# Patient Record
Sex: Female | Born: 1975 | Race: White | Hispanic: No | Smoking: Never smoker
Health system: Southern US, Community
[De-identification: ages and names within clinical notes are randomized; demographics above are authoritative.]

## PROBLEM LIST (undated history)

## (undated) DIAGNOSIS — F419 Anxiety disorder, unspecified: Secondary | ICD-10-CM

## (undated) DIAGNOSIS — M1990 Unspecified osteoarthritis, unspecified site: Secondary | ICD-10-CM

## (undated) DIAGNOSIS — I10 Essential (primary) hypertension: Secondary | ICD-10-CM

## (undated) DIAGNOSIS — E785 Hyperlipidemia, unspecified: Secondary | ICD-10-CM

## (undated) DIAGNOSIS — R55 Syncope and collapse: Secondary | ICD-10-CM

## (undated) DIAGNOSIS — T7840XA Allergy, unspecified, initial encounter: Secondary | ICD-10-CM

## (undated) DIAGNOSIS — F32A Depression, unspecified: Secondary | ICD-10-CM

## (undated) DIAGNOSIS — E119 Type 2 diabetes mellitus without complications: Secondary | ICD-10-CM

## (undated) DIAGNOSIS — Z5189 Encounter for other specified aftercare: Secondary | ICD-10-CM

## (undated) HISTORY — DX: Unspecified osteoarthritis, unspecified site: M19.90

## (undated) HISTORY — PX: BILATERAL CARPAL TUNNEL RELEASE: SHX6508

## (undated) HISTORY — PX: CHOLECYSTECTOMY: SHX55

## (undated) HISTORY — DX: Type 2 diabetes mellitus without complications: E11.9

## (undated) HISTORY — PX: TOTAL ABDOMINAL HYSTERECTOMY W/ BILATERAL SALPINGOOPHORECTOMY: SHX83

## (undated) HISTORY — DX: Depression, unspecified: F32.A

## (undated) HISTORY — PX: ABDOMINAL HYSTERECTOMY: SHX81

## (undated) HISTORY — DX: Anxiety disorder, unspecified: F41.9

## (undated) HISTORY — DX: Hyperlipidemia, unspecified: E78.5

## (undated) HISTORY — DX: Allergy, unspecified, initial encounter: T78.40XA

## (undated) HISTORY — DX: Essential (primary) hypertension: I10

## (undated) HISTORY — DX: Encounter for other specified aftercare: Z51.89

## (undated) HISTORY — DX: Syncope and collapse: R55

---

## 2015-05-14 DIAGNOSIS — F32A Depression, unspecified: Secondary | ICD-10-CM | POA: Insufficient documentation

## 2016-07-13 DIAGNOSIS — G8929 Other chronic pain: Secondary | ICD-10-CM | POA: Insufficient documentation

## 2016-07-13 DIAGNOSIS — M545 Low back pain, unspecified: Secondary | ICD-10-CM | POA: Insufficient documentation

## 2017-11-01 DIAGNOSIS — G43909 Migraine, unspecified, not intractable, without status migrainosus: Secondary | ICD-10-CM | POA: Insufficient documentation

## 2018-09-22 DIAGNOSIS — E1169 Type 2 diabetes mellitus with other specified complication: Secondary | ICD-10-CM | POA: Insufficient documentation

## 2019-02-12 DIAGNOSIS — M26609 Unspecified temporomandibular joint disorder, unspecified side: Secondary | ICD-10-CM | POA: Insufficient documentation

## 2020-10-17 DIAGNOSIS — F419 Anxiety disorder, unspecified: Secondary | ICD-10-CM | POA: Diagnosis not present

## 2020-10-17 DIAGNOSIS — E785 Hyperlipidemia, unspecified: Secondary | ICD-10-CM | POA: Diagnosis not present

## 2020-10-17 DIAGNOSIS — I10 Essential (primary) hypertension: Secondary | ICD-10-CM | POA: Diagnosis not present

## 2020-10-17 DIAGNOSIS — R7303 Prediabetes: Secondary | ICD-10-CM | POA: Diagnosis not present

## 2020-11-05 DIAGNOSIS — E119 Type 2 diabetes mellitus without complications: Secondary | ICD-10-CM | POA: Diagnosis not present

## 2020-11-05 DIAGNOSIS — E785 Hyperlipidemia, unspecified: Secondary | ICD-10-CM | POA: Diagnosis not present

## 2020-11-05 DIAGNOSIS — I10 Essential (primary) hypertension: Secondary | ICD-10-CM | POA: Diagnosis not present

## 2020-11-13 DIAGNOSIS — E119 Type 2 diabetes mellitus without complications: Secondary | ICD-10-CM | POA: Diagnosis not present

## 2020-11-13 DIAGNOSIS — Z713 Dietary counseling and surveillance: Secondary | ICD-10-CM | POA: Diagnosis not present

## 2020-11-13 DIAGNOSIS — E1169 Type 2 diabetes mellitus with other specified complication: Secondary | ICD-10-CM | POA: Insufficient documentation

## 2021-03-28 ENCOUNTER — Encounter: Payer: Self-pay | Admitting: Nurse Practitioner

## 2021-03-28 DIAGNOSIS — J309 Allergic rhinitis, unspecified: Secondary | ICD-10-CM | POA: Insufficient documentation

## 2021-03-28 DIAGNOSIS — I152 Hypertension secondary to endocrine disorders: Secondary | ICD-10-CM | POA: Insufficient documentation

## 2021-03-28 DIAGNOSIS — E1159 Type 2 diabetes mellitus with other circulatory complications: Secondary | ICD-10-CM | POA: Insufficient documentation

## 2021-03-28 DIAGNOSIS — K219 Gastro-esophageal reflux disease without esophagitis: Secondary | ICD-10-CM | POA: Insufficient documentation

## 2021-03-28 DIAGNOSIS — Z90710 Acquired absence of both cervix and uterus: Secondary | ICD-10-CM | POA: Insufficient documentation

## 2021-04-07 ENCOUNTER — Ambulatory Visit (INDEPENDENT_AMBULATORY_CARE_PROVIDER_SITE_OTHER): Payer: BC Managed Care – PPO | Admitting: Nurse Practitioner

## 2021-04-07 ENCOUNTER — Encounter: Payer: Self-pay | Admitting: Nurse Practitioner

## 2021-04-07 ENCOUNTER — Other Ambulatory Visit: Payer: Self-pay

## 2021-04-07 VITALS — BP 128/82 | HR 76 | Temp 98.8°F | Ht 60.0 in | Wt 193.0 lb

## 2021-04-07 DIAGNOSIS — E785 Hyperlipidemia, unspecified: Secondary | ICD-10-CM | POA: Diagnosis not present

## 2021-04-07 DIAGNOSIS — F32A Depression, unspecified: Secondary | ICD-10-CM

## 2021-04-07 DIAGNOSIS — F419 Anxiety disorder, unspecified: Secondary | ICD-10-CM

## 2021-04-07 DIAGNOSIS — J3489 Other specified disorders of nose and nasal sinuses: Secondary | ICD-10-CM | POA: Diagnosis not present

## 2021-04-07 DIAGNOSIS — E1159 Type 2 diabetes mellitus with other circulatory complications: Secondary | ICD-10-CM

## 2021-04-07 DIAGNOSIS — I152 Hypertension secondary to endocrine disorders: Secondary | ICD-10-CM

## 2021-04-07 DIAGNOSIS — E1169 Type 2 diabetes mellitus with other specified complication: Secondary | ICD-10-CM | POA: Diagnosis not present

## 2021-04-07 DIAGNOSIS — Z7689 Persons encountering health services in other specified circumstances: Secondary | ICD-10-CM

## 2021-04-07 DIAGNOSIS — R053 Chronic cough: Secondary | ICD-10-CM | POA: Insufficient documentation

## 2021-04-07 DIAGNOSIS — R0683 Snoring: Secondary | ICD-10-CM

## 2021-04-07 LAB — MICROALBUMIN, URINE WAIVED
Creatinine, Urine Waived: 50 mg/dL (ref 10–300)
Microalb, Ur Waived: 10 mg/L (ref 0–19)
Microalb/Creat Ratio: <30 mg/g (ref <30)

## 2021-04-07 LAB — VERITOR FLU A/B WAIVED
Influenza A: NEGATIVE
Influenza B: NEGATIVE

## 2021-04-07 LAB — BAYER DCA HB A1C WAIVED: HB A1C (BAYER DCA - WAIVED): 6.4 % — ABNORMAL HIGH (ref 4.8–5.6)

## 2021-04-07 MED ORDER — BAYER CONTOUR LINK 2.4 W/DEVICE KIT: PACK | 0 refills | Status: DC

## 2021-04-07 MED ORDER — ACCU-CHEK SOFTCLIX LANCETS MISC: 12 refills | Status: DC

## 2021-04-07 MED ORDER — GLUCOSE BLOOD VI STRP: ORAL_STRIP | 12 refills | Status: DC

## 2021-04-07 NOTE — Assessment & Plan Note (Signed)
Acute and starting with symptoms this morning -- will obtain Covid and flu testing in office.  Recommend she avoid Sudafed and Ibuprofen at home due to HTN -- recommend she use Coricidin and Claritin or Allegra as needed for symptoms control.  No abx at this time due to symptoms <24 hours.  Recommend: - Increased rest - Increasing Fluids - Acetaminophen as needed for fever/pain.  - Salt water gargling, chloraseptic spray and throat lozenges - Diabetic Tussin, Coricidin, or Claritin/Allegra as needed - Humidifying the air Return to office if ongoing or worsening -- will treat as needed with abx if symptoms ongoing 7 days or greater.

## 2021-04-07 NOTE — Assessment & Plan Note (Signed)
BMI 37.69 with T2DM, HTN/HLD.  Recommended eating smaller high protein, low fat meals more frequently and exercising 30 mins a day 5 times a week with a goal of 10-15lb weight loss in the next 3 months. Patient voiced their understanding and motivation to adhere to these recommendations.

## 2021-04-07 NOTE — Assessment & Plan Note (Signed)
Chronic, ongoing.  Continue current medication regimen and adjust as needed. Lipid panel today. 

## 2021-04-07 NOTE — Assessment & Plan Note (Signed)
Chronic, stable, denies SI/HI.  Has been on Prozac with benefit for years, continue this regimen and adjust as needed.  She denies need for refills at this time, send as needed.

## 2021-04-07 NOTE — Assessment & Plan Note (Signed)
Chronic, ongoing with initial BP elevated today (recently took Sudafed for sinus cold and Ibuprofen -- advised her not to do this), repeat BP showed improvement.  Recommend she monitor BP at least a few mornings a week at home and document.  DASH diet at home.  Continue current medication regimen and adjust as needed.  Labs today: CBC, TSH, CMP.  Return in February for physical.

## 2021-04-07 NOTE — Progress Notes (Signed)
New Patient Office Visit  Subjective:  Patient ID: Tiffany Odom, female    DOB: [birth year]  Age: 45 y.o. MRN: [id number]  CC:  Chief Complaint  Patient presents with   Establish Care    Patient states she is due for A1c testing which is due next month and was wondering if she is to have it completed at today's visit.    Sinus Problem    Patient states she woke up with a sore throat and nasal sinus drip. Patient denies taking an at-home COVID test. Patient states she took a nasal decongestant about 9 this morning and states it did help a little bit. Patient states that she has a history of getting real bad recurring sinus infections.   Sore Throat   Medication Management    Patient states she was on Lisinopril for about 10 years and then she started noticing symptoms of itching and flares up. Patient states she feels that medication helped a lot more than her current blood pressure medication. Patient states it works sometimes but she rather be taking some thing close to Lisinopril.    HPI Tiffany Odom presents for new patient visit to establish care.  Introduced to Designer, jewellery role and practice setting.  All questions answered.  Discussed provider/patient relationship and expectations. Recently moved from [facility] to [place] -- works at [employer].  SINUS ISSUES Woke up this morning with a little sore throat and sinus drainage.  No Covid testing at home.  Has had Covid vaccines. Fever: no Cough: no Shortness of breath: no Wheezing: no Chest pain: no Chest tightness: no Chest congestion: no Nasal congestion: yes Runny nose: yes Post nasal drip: yes Sneezing: no Sore throat: yes Swollen glands: no Sinus pressure: yes Headache: yes Face pain: no Toothache: no Ear pain: none Ear pressure: yes bilateral Eyes red/itching:no Eye drainage/crusting: no  Vomiting: no Rash: no Fatigue: yes Sick contacts: no Strep contacts: no  Context: stable Recurrent sinusitis:  no Relief with OTC cold/cough medications: yes  Treatments attempted:  decongestant and Ibuprofen     DIABETES Currently taking no medications, her last A1c was 6.5% on 10/20/20.  Has never taken medications for this, at April visit is when diabetes was first diagnosed. Hypoglycemic episodes:no Polydipsia/polyuria: no Visual disturbance: no Chest pain: no Paresthesias: no Glucose Monitoring: no  Accucheck frequency: Not Checking  Fasting glucose:  Post prandial:  Evening:  Before meals: Taking Insulin?: no  Long acting insulin:  Short acting insulin: Blood Pressure Monitoring: rarely Retinal Examination: Not up to Date Foot Exam: Up to Date Diabetic Education:  did one class Pneumovax: Not up to Date Influenza:  would like today Aspirin: no   HYPERTENSION / HYPERLIPIDEMIA Currently taking Atorvastatin and Irbesartan daily -- started on Irbesartan back in April or May due to cough with ACE.  Last LDL on 10/17/20 = 170.  Has cough with Lisinopril after being on this for 10 years.  Rosuvastatin caused muscle aches.  Did take Sudafed this morning and BP elevated + Ibuprofen. Satisfied with current treatment? yes Duration of hypertension: chronic BP monitoring frequency: rarely BP range:  BP medication side effects: no Past BP meds: Lisinopril Duration of hyperlipidemia: chronic Cholesterol medication side effects: no Cholesterol supplements: none Past cholesterol medications: Rosuvastatin Medication compliance: good compliance Aspirin: no Recent stressors: no Recurrent headaches: no Visual changes: no Palpitations: no Dyspnea: no Chest pain: no Lower extremity edema: no Dizzy/lightheaded: no   DEPRESSION Takes Prozac for depression daily, has been on for  14 years. Mood status: stable Satisfied with current treatment?: yes Symptom severity: moderate  Duration of current treatment : chronic Side effects: no Medication compliance: good  compliance Psychotherapy/counseling: yes in past Previous psychiatric medications: none Depressed mood: no Anxious mood: no Anhedonia: no Significant weight loss or gain: no Insomnia: none Fatigue: yes -- this has been ongoing for a long while Feelings of worthlessness or guilt: no Impaired concentration/indecisiveness: no Suicidal ideations: no Hopelessness: no Crying spells: no Depression screen PHQ 2/9 04/07/2021  Decreased Interest 0  Down, Depressed, Hopeless 1  PHQ - 2 Score 1  Altered sleeping 0  Tired, decreased energy 3  Change in appetite 0  Feeling bad or failure about yourself  0  Trouble concentrating 1  Moving slowly or fidgety/restless 0  Suicidal thoughts 0  PHQ-9 Score 5  Difficult doing work/chores Somewhat difficult    Past Medical History:  Diagnosis Date   Depression    Diabetes ([place])    Hyperlipemia    Hypertension     Past Surgical History:  Procedure Laterality Date   ABDOMINAL HYSTERECTOMY     BILATERAL CARPAL TUNNEL RELEASE     CESAREAN SECTION     CHOLECYSTECTOMY      Family History  Problem Relation Age of Onset   Diabetes Mother    ADD / ADHD Mother    Heart Problems Mother    Diabetes Father    Heart Problems Father    Healthy Sister    Healthy Sister    Healthy Sister    Healthy Sister    Healthy Sister    Healthy Brother    Heart Problems Maternal Grandmother    Diabetes Maternal Grandmother    Heart Problems Paternal Grandmother    Diabetes Paternal Grandmother     Social History   Socioeconomic History   Marital status: Divorced    Spouse name: Not on file   Number of children: Not on file   Years of education: Not on file   Highest education level: Not on file  Occupational History   Not on file  Tobacco Use   Smoking status: Never   Smokeless tobacco: Never  Vaping Use   Vaping Use: Never used  Substance and Sexual Activity   Alcohol use: Yes   Drug use: Not on file   Sexual activity: Not on file   Other Topics Concern   Not on file  Social History Narrative   Not on file   Social Determinants of Health   Financial Resource Strain: Low Risk    Difficulty of Paying Living Expenses: Not hard at all  Food Insecurity: No Food Insecurity   Worried About Charity fundraiser in the Last Year: Never true   Ran Out of Food in the Last Year: Never true  Transportation Needs: No Transportation Needs   Lack of Transportation (Medical): No   Lack of Transportation (Non-Medical): No  Physical Activity: Insufficiently Active   Days of Exercise per Week: 4 days   Minutes of Exercise per Session: 30 min  Stress: No Stress Concern Present   Feeling of Stress : Only a little  Social Connections: Socially Isolated   Frequency of Communication with Friends and Family: Three times a week   Frequency of Social Gatherings with Friends and Family: Three times a week   Attends Religious Services: Never   Active Member of Clubs or Organizations: Not on file   Attends Archivist Meetings: Never   Marital Status: Separated  Intimate Partner Violence: Not At Risk   Fear of Current or Ex-Partner: No   Emotionally Abused: No   Physically Abused: No   Sexually Abused: No    ROS Review of Systems  Constitutional:  Positive for fatigue. Negative for activity change, appetite change and fever.  HENT:  Positive for congestion, postnasal drip, rhinorrhea, sinus pressure and sore throat. Negative for ear discharge, ear pain, facial swelling, sinus pain, sneezing and voice change.   Respiratory:  Negative for cough, chest tightness, shortness of breath and wheezing.   Cardiovascular:  Negative for chest pain, palpitations and leg swelling.  Gastrointestinal: Negative.   Endocrine: Negative.   Neurological:  Negative for dizziness, numbness and headaches.  Psychiatric/Behavioral: Negative.     Objective:   Today's Vitals: BP 128/82 (BP Location: Left Arm, Patient Position: Sitting, Cuff Size:  Normal)   Pulse 76   Temp 98.8 F (37.1 C) (Oral)   Ht 5' (1.524 m)   Wt 193 lb (87.5 kg)   SpO2 98%   BMI 37.69 kg/m   Physical Exam Vitals and nursing note reviewed.  Constitutional:      General: She is awake. She is not in acute distress.    Appearance: She is well-developed and well-groomed. She is obese. She is not ill-appearing or toxic-appearing.  HENT:     Head: Normocephalic.     Right Ear: Hearing, ear canal and external ear normal. A middle ear effusion is present.     Left Ear: Hearing, ear canal and external ear normal. A middle ear effusion is present.     Nose: Rhinorrhea present. Rhinorrhea is clear.     Right Sinus: No maxillary sinus tenderness or frontal sinus tenderness.     Left Sinus: No maxillary sinus tenderness or frontal sinus tenderness.     Mouth/Throat:     Mouth: Mucous membranes are moist.     Pharynx: Oropharynx is clear. Posterior oropharyngeal erythema (mild with cobblestoning) present. No pharyngeal swelling or oropharyngeal exudate.     Tonsils: No tonsillar exudate. 2+ on the right. 2+ on the left.  Eyes:     General: Lids are normal.        Right eye: No discharge.        Left eye: No discharge.     Conjunctiva/sclera: Conjunctivae normal.     Pupils: Pupils are equal, round, and reactive to light.  Neck:     Thyroid: No thyromegaly.     Vascular: No carotid bruit.  Cardiovascular:     Rate and Rhythm: Normal rate and regular rhythm.     Heart sounds: Normal heart sounds. No murmur heard.   No gallop.  Pulmonary:     Effort: Pulmonary effort is normal. No accessory muscle usage or respiratory distress.     Breath sounds: Normal breath sounds.  Abdominal:     General: Bowel sounds are normal.     Palpations: Abdomen is soft. There is no hepatomegaly or splenomegaly.  Musculoskeletal:     Cervical back: Normal range of motion and neck supple.     Right lower leg: No edema.     Left lower leg: No edema.  Lymphadenopathy:      Cervical: No cervical adenopathy.  Skin:    General: Skin is warm and dry.  Neurological:     Mental Status: She is alert and oriented to person, place, and time.     Deep Tendon Reflexes: Reflexes are normal and symmetric.     Reflex  Scores:      Brachioradialis reflexes are 2+ on the right side and 2+ on the left side.      Patellar reflexes are 2+ on the right side and 2+ on the left side. Psychiatric:        Attention and Perception: Attention normal.        Mood and Affect: Mood normal.        Speech: Speech normal.        Behavior: Behavior normal. Behavior is cooperative.        Thought Content: Thought content normal.    Assessment & Plan:   Problem List Items Addressed This Visit       Cardiovascular and Mediastinum   Hypertension associated with diabetes ([place])    Chronic, ongoing with initial BP elevated today (recently took Sudafed for sinus cold and Ibuprofen -- advised her not to do this), repeat BP showed improvement.  Recommend she monitor BP at least a few mornings a week at home and document.  DASH diet at home.  Continue current medication regimen and adjust as needed.  Labs today: CBC, TSH, CMP.  Return in February for physical.       Relevant Medications   atorvastatin (LIPITOR) 80 MG tablet   irbesartan (AVAPRO) 150 MG tablet   Other Relevant Orders   Comprehensive metabolic panel   TSH   CBC with Differential/Platelet   Ambulatory referral to Sleep Studies     Endocrine   Hyperlipidemia associated with type 2 diabetes mellitus (HCC)    Chronic, ongoing.  Continue current medication regimen and adjust as needed.  Lipid panel today.         Relevant Medications   atorvastatin (LIPITOR) 80 MG tablet   irbesartan (AVAPRO) 150 MG tablet   Other Relevant Orders   Comprehensive metabolic panel   Lipid Panel w/o Chol/HDL Ratio   Type 2 diabetes mellitus with morbid obesity (HCC)    Chronic, ongoing with A1c downward trend today at 6.4% and urine ALB 10.   At this time will continue diet control, discussed with patient options available if elevation in future; such as GLP1, SGLT2, or Metformin.  She is working on diet changes and regular exercise with walks.  Recommend she check blood sugar at home a few days a week with goal in morning fasting <130 and goal 2 hours after eating <180.  Return in February for physical.      Relevant Medications   atorvastatin (LIPITOR) 80 MG tablet   irbesartan (AVAPRO) 150 MG tablet   Other Relevant Orders   Bayer DCA Hb A1c Waived (Completed)   Comprehensive metabolic panel   Microalbumin, Urine Waived (Completed)     Other   Anxiety and depression    Chronic, stable, denies SI/HI.  Has been on Prozac with benefit for years, continue this regimen and adjust as needed.  She denies need for refills at this time, send as needed.      Relevant Medications   FLUoxetine (PROZAC) 20 MG capsule   Morbid obesity (HCC)    BMI 37.69 with T2DM, HTN/HLD.  Recommended eating smaller high protein, low fat meals more frequently and exercising 30 mins a day 5 times a week with a goal of 10-15lb weight loss in the next 3 months. Patient voiced their understanding and motivation to adhere to these recommendations.       Relevant Orders   Ambulatory referral to Sleep Studies   Sinus pressure    Acute and  starting with symptoms this morning -- will obtain Covid and flu testing in office.  Recommend she avoid Sudafed and Ibuprofen at home due to HTN -- recommend she use Coricidin and Claritin or Allegra as needed for symptoms control.  No abx at this time due to symptoms <24 hours.  Recommend: - Increased rest - Increasing Fluids - Acetaminophen as needed for fever/pain.  - Salt water gargling, chloraseptic spray and throat lozenges - Diabetic Tussin, Coricidin, or Claritin/Allegra as needed - Humidifying the air Return to office if ongoing or worsening -- will treat as needed with abx if symptoms ongoing 7 days or  greater.      Relevant Orders   Veritor Flu A/B Waived (Completed)   Novel Coronavirus, NAA ([facility])   Other Visit Diagnoses     Encounter to establish care    -  Primary   Snores       Send for sleep study.  Referral in place.   Relevant Orders   Ambulatory referral to Sleep Studies       Outpatient Encounter Medications as of 04/07/2021  Medication Sig   Accu-Chek Softclix Lancets lancets Use to check blood sugars 2-3 times daily with goals = <130 fasting in morning and <180 two hours after eating.  Bring blood sugar log to appointments.   atorvastatin (LIPITOR) 80 MG tablet Take by mouth.   Blood Glucose Monitoring Suppl (BAYER CONTOUR LINK 2.4) w/Device KIT Use to check blood sugar 1-2 times daily with goal <130 fasting in morning and <180 two hours after eating.   FLUoxetine (PROZAC) 20 MG capsule Take by mouth.   glucose blood test strip Use to check blood sugar 3 times daily, fasting in morning with goal <130 and 2 hours after meals with goal <180.  Bring blood sugar log to visits.   irbesartan (AVAPRO) 150 MG tablet Take by mouth.   No facility-administered encounter medications on file as of 04/07/2021.    Follow-up: Return in about 4 months (around 08/05/2021) for Annual physical.   Venita Lick, NP

## 2021-04-07 NOTE — Assessment & Plan Note (Signed)
Chronic, ongoing with A1c downward trend today at 6.4% and urine ALB 10.  At this time will continue diet control, discussed with patient options available if elevation in future; such as GLP1, SGLT2, or Metformin.  She is working on diet changes and regular exercise with walks.  Recommend she check blood sugar at home a few days a week with goal in morning fasting <130 and goal 2 hours after eating <180.  Return in February for physical.

## 2021-04-07 NOTE — Patient Instructions (Signed)
Diabetes Mellitus and Nutrition, Adult When you have diabetes, or diabetes mellitus, it is very important to have healthy eating habits because your blood sugar (glucose) levels are greatly affected by what you eat and drink. Eating healthy foods in the right amounts, at about the same times every day, can help you:  Control your blood glucose.  Lower your risk of heart disease.  Improve your blood pressure.  Reach or maintain a healthy weight. What can affect my meal plan? Every person with diabetes is different, and each person has different needs for a meal plan. Your health care provider may recommend that you work with a dietitian to make a meal plan that is best for you. Your meal plan may vary depending on factors such as:  The calories you need.  The medicines you take.  Your weight.  Your blood glucose, blood pressure, and cholesterol levels.  Your activity level.  Other health conditions you have, such as heart or kidney disease. How do carbohydrates affect me? Carbohydrates, also called carbs, affect your blood glucose level more than any other type of food. Eating carbs naturally raises the amount of glucose in your blood. Carb counting is a method for keeping track of how many carbs you eat. Counting carbs is important to keep your blood glucose at a healthy level, especially if you use insulin or take certain oral diabetes medicines. It is important to know how many carbs you can safely have in each meal. This is different for every person. Your dietitian can help you calculate how many carbs you should have at each meal and for each snack. How does alcohol affect me? Alcohol can cause a sudden decrease in blood glucose (hypoglycemia), especially if you use insulin or take certain oral diabetes medicines. Hypoglycemia can be a life-threatening condition. Symptoms of hypoglycemia, such as sleepiness, dizziness, and confusion, are similar to symptoms of having too much  alcohol.  Do not drink alcohol if: ? Your health care provider tells you not to drink. ? You are pregnant, may be pregnant, or are planning to become pregnant.  If you drink alcohol: ? Do not drink on an empty stomach. ? Limit how much you use to:  0-1 drink a day for women.  0-2 drinks a day for men. ? Be aware of how much alcohol is in your drink. In the [place] one drink equals one 12 oz bottle of beer (355 mL), one 5 oz glass of wine (148 mL), or one 1 oz glass of hard liquor (44 mL). ? Keep yourself hydrated with water, diet soda, or unsweetened iced tea.  Keep in mind that regular soda, juice, and other mixers may contain a lot of sugar and must be counted as carbs. What are tips for following this plan? Reading food labels  Start by checking the serving size on the "Nutrition Facts" label of packaged foods and drinks. The amount of calories, carbs, fats, and other nutrients listed on the label is based on one serving of the item. Many items contain more than one serving per package.  Check the total grams (g) of carbs in one serving. You can calculate the number of servings of carbs in one serving by dividing the total carbs by 15. For example, if a food has 30 g of total carbs per serving, it would be equal to 2 servings of carbs.  Check the number of grams (g) of saturated fats and trans fats in one serving. Choose foods that have   a low amount or none of these fats.  Check the number of milligrams (mg) of salt (sodium) in one serving. Most people should limit total sodium intake to less than 2,300 mg per day.  Always check the nutrition information of foods labeled as "low-fat" or "nonfat." These foods may be higher in added sugar or refined carbs and should be avoided.  Talk to your dietitian to identify your daily goals for nutrients listed on the label. Shopping  Avoid buying canned, pre-made, or processed foods. These foods tend to be high in fat, sodium, and added  sugar.  Shop around the outside edge of the grocery store. This is where you will most often find fresh fruits and vegetables, bulk grains, fresh meats, and fresh dairy. Cooking  Use low-heat cooking methods, such as baking, instead of high-heat cooking methods like deep frying.  Cook using healthy oils, such as olive, canola, or sunflower oil.  Avoid cooking with butter, cream, or high-fat meats. Meal planning  Eat meals and snacks regularly, preferably at the same times every day. Avoid going long periods of time without eating.  Eat foods that are high in fiber, such as fresh fruits, vegetables, beans, and whole grains. Talk with your dietitian about how many servings of carbs you can eat at each meal.  Eat 4-6 oz (112-168 g) of lean protein each day, such as lean meat, chicken, fish, eggs, or tofu. One ounce (oz) of lean protein is equal to: ? 1 oz (28 g) of meat, chicken, or fish. ? 1 egg. ?  cup (62 g) of tofu.  Eat some foods each day that contain healthy fats, such as avocado, nuts, seeds, and fish.   What foods should I eat? Fruits Berries. Apples. Oranges. Peaches. Apricots. Plums. Grapes. Mango. Papaya. Pomegranate. Kiwi. Cherries. Vegetables Lettuce. Spinach. Leafy greens, including kale, chard, collard greens, and mustard greens. Beets. Cauliflower. Cabbage. Broccoli. Carrots. Green beans. Tomatoes. Peppers. Onions. Cucumbers. Brussels sprouts. Grains Whole grains, such as whole-wheat or whole-grain bread, crackers, tortillas, cereal, and pasta. Unsweetened oatmeal. Quinoa. Brown or wild rice. Meats and other proteins Seafood. Poultry without skin. Lean cuts of poultry and beef. Tofu. Nuts. Seeds. Dairy Low-fat or fat-free dairy products such as milk, yogurt, and cheese. The items listed above may not be a complete list of foods and beverages you can eat. Contact a dietitian for more information. What foods should I avoid? Fruits Fruits canned with  syrup. Vegetables Canned vegetables. Frozen vegetables with butter or cream sauce. Grains Refined white flour and flour products such as bread, pasta, snack foods, and cereals. Avoid all processed foods. Meats and other proteins Fatty cuts of meat. Poultry with skin. Breaded or fried meats. Processed meat. Avoid saturated fats. Dairy Full-fat yogurt, cheese, or milk. Beverages Sweetened drinks, such as soda or iced tea. The items listed above may not be a complete list of foods and beverages you should avoid. Contact a dietitian for more information. Questions to ask a health care provider  Do I need to meet with a diabetes educator?  Do I need to meet with a dietitian?  What number can I call if I have questions?  When are the best times to check my blood glucose? Where to find more information:  American Diabetes Association: diabetes.org  Academy of Nutrition and Dietetics: www.eatright.org  National Institute of Diabetes and Digestive and Kidney Diseases: www.niddk.nih.gov  Association of Diabetes Care and Education Specialists: www.diabeteseducator.org Summary  It is important to have healthy eating   habits because your blood sugar (glucose) levels are greatly affected by what you eat and drink.  A healthy meal plan will help you control your blood glucose and maintain a healthy lifestyle.  Your health care provider may recommend that you work with a dietitian to make a meal plan that is best for you.  Keep in mind that carbohydrates (carbs) and alcohol have immediate effects on your blood glucose levels. It is important to count carbs and to use alcohol carefully. This information is not intended to replace advice given to you by your health care provider. Make sure you discuss any questions you have with your health care provider. Document Revised: 05/22/2019 Document Reviewed: 05/22/2019 Elsevier Patient Education  2021 Elsevier Inc.  

## 2021-04-08 LAB — CBC WITH DIFFERENTIAL/PLATELET
Basophils Absolute: 0.1 10*3/uL (ref 0.0–0.2)
Basos: 1 %
EOS (ABSOLUTE): 0.2 10*3/uL (ref 0.0–0.4)
Eos: 2 %
Hematocrit: 40.2 % (ref 34.0–46.6)
Hemoglobin: 13.8 g/dL (ref 11.1–15.9)
Immature Grans (Abs): 0 10*3/uL (ref 0.0–0.1)
Immature Granulocytes: 0 %
Lymphocytes Absolute: 2.7 10*3/uL (ref 0.7–3.1)
Lymphs: 29 %
MCH: 30.7 pg (ref 26.6–33.0)
MCHC: 34.3 g/dL (ref 31.5–35.7)
MCV: 89 fL (ref 79–97)
Monocytes Absolute: 0.6 10*3/uL (ref 0.1–0.9)
Monocytes: 6 %
Neutrophils Absolute: 5.9 10*3/uL (ref 1.4–7.0)
Neutrophils: 62 %
Platelets: 362 10*3/uL (ref 150–450)
RBC: 4.5 x10E6/uL (ref 3.77–5.28)
RDW: 11.8 % (ref 11.7–15.4)
WBC: 9.5 10*3/uL (ref 3.4–10.8)

## 2021-04-08 LAB — LIPID PANEL W/O CHOL/HDL RATIO
Cholesterol, Total: 226 mg/dL — ABNORMAL HIGH (ref 100–199)
HDL: 27 mg/dL — ABNORMAL LOW (ref >39)
LDL Chol Calc (NIH): 120 mg/dL — ABNORMAL HIGH (ref 0–99)
Triglycerides: 443 mg/dL — ABNORMAL HIGH (ref 0–149)
VLDL Cholesterol Cal: 79 mg/dL — ABNORMAL HIGH (ref 5–40)

## 2021-04-08 LAB — COMPREHENSIVE METABOLIC PANEL
ALT: 24 IU/L (ref 0–32)
AST: 20 IU/L (ref 0–40)
Albumin/Globulin Ratio: 1.7 (ref 1.2–2.2)
Albumin: 4.4 g/dL (ref 3.8–4.8)
Alkaline Phosphatase: 103 IU/L (ref 44–121)
BUN/Creatinine Ratio: 19 (ref 9–23)
BUN: 17 mg/dL (ref 6–24)
Bilirubin Total: 0.6 mg/dL (ref 0.0–1.2)
CO2: 24 mmol/L (ref 20–29)
Calcium: 9.6 mg/dL (ref 8.7–10.2)
Chloride: 98 mmol/L (ref 96–106)
Creatinine, Ser: 0.91 mg/dL (ref 0.57–1.00)
Globulin, Total: 2.6 g/dL (ref 1.5–4.5)
Glucose: 104 mg/dL — ABNORMAL HIGH (ref 70–99)
Potassium: 4 mmol/L (ref 3.5–5.2)
Sodium: 136 mmol/L (ref 134–144)
Total Protein: 7 g/dL (ref 6.0–8.5)
eGFR: 80 mL/min/{1.73_m2} (ref >59)

## 2021-04-08 LAB — NOVEL CORONAVIRUS, NAA: SARS-CoV-2, NAA: NOT DETECTED

## 2021-04-08 LAB — TSH: TSH: 1.34 u[IU]/mL (ref 0.450–4.500)

## 2021-04-08 LAB — SARS-COV-2, NAA 2 DAY TAT

## 2021-04-08 NOTE — Progress Notes (Signed)
Contacted via MyChart   Good afternoon Dabney, your Covid returned negative too.  This is most likely another viral cold, there are quite a few going around.  Continue resting at home and increasing fluid + using over the counter medications we discussed.  If any worsening or have symptoms 7 days or more then we will start antibiotic.  Have a great day!! Keep being amazing!!  Thank you for allowing me to participate in your care.  I appreciate you. Kindest regards, Virjean Boman

## 2021-04-13 ENCOUNTER — Telehealth: Payer: Self-pay

## 2021-04-13 MED ORDER — AMOXICILLIN-POT CLAVULANATE 875-125 MG PO TABS: 1.0000 | ORAL_TABLET | Freq: Two times a day (BID) | ORAL | 0 refills | Status: DC

## 2021-04-13 MED ORDER — ONETOUCH VERIO VI STRP: ORAL_STRIP | 12 refills | Status: DC

## 2021-04-13 MED ORDER — ONETOUCH VERIO W/DEVICE KIT: PACK | 0 refills | Status: DC

## 2021-04-13 MED ORDER — ONETOUCH ULTRASOFT LANCETS MISC: 12 refills | Status: DC

## 2021-04-13 MED ORDER — PREDNISONE 20 MG PO TABS: 40.0000 mg | ORAL_TABLET | Freq: Every day | ORAL | 0 refills | Status: DC

## 2021-04-13 NOTE — Addendum Note (Signed)
Addended by: Aura Dials T on: 04/13/2021 04:44 PM   Modules accepted: Orders

## 2021-04-13 NOTE — Telephone Encounter (Addendum)
  Pt returned our call. Reviewed lab results.  Pt is still experiencing sinus pressure, a runny nose cough, and producing yellowish mucous.   Pt has taken musinex sinus an OTC medication which has provided little relief.   Pt Jolene's note - pt would like something called in to help manage s/s.         ----- Message from Malen Gauze, CMA sent at 04/13/2021  8:47 AM EDT ----- Left message for patient to give our office a call back to discuss recent lab results.   OK for [facility] to give result note if patient calls back.

## 2021-04-14 ENCOUNTER — Other Ambulatory Visit: Payer: Self-pay | Admitting: Nurse Practitioner

## 2021-04-14 MED ORDER — PREDNISONE 20 MG PO TABS: 40.0000 mg | ORAL_TABLET | Freq: Every day | ORAL | 0 refills | Status: AC

## 2021-04-14 MED ORDER — AMOXICILLIN-POT CLAVULANATE 875-125 MG PO TABS: 1.0000 | ORAL_TABLET | Freq: Two times a day (BID) | ORAL | 0 refills | Status: AC

## 2021-05-01 ENCOUNTER — Other Ambulatory Visit: Payer: Self-pay

## 2021-05-01 ENCOUNTER — Ambulatory Visit (INDEPENDENT_AMBULATORY_CARE_PROVIDER_SITE_OTHER): Payer: BC Managed Care – PPO | Admitting: Nurse Practitioner

## 2021-05-01 ENCOUNTER — Encounter: Payer: Self-pay | Admitting: Nurse Practitioner

## 2021-05-01 DIAGNOSIS — R053 Chronic cough: Secondary | ICD-10-CM

## 2021-05-01 MED ORDER — ALBUTEROL SULFATE HFA 108 (90 BASE) MCG/ACT IN AERS: 2.0000 | INHALATION_SPRAY | Freq: Four times a day (QID) | RESPIRATORY_TRACT | 0 refills | Status: DC | PRN

## 2021-05-01 MED ORDER — DOXYCYCLINE HYCLATE 100 MG PO TABS: 100.0000 mg | ORAL_TABLET | Freq: Two times a day (BID) | ORAL | 0 refills | Status: DC

## 2021-05-01 MED ORDER — PREDNISONE 20 MG PO TABS: 40.0000 mg | ORAL_TABLET | Freq: Every day | ORAL | 0 refills | Status: AC

## 2021-05-01 NOTE — Progress Notes (Signed)
BP (!) 151/87   Pulse 71   Temp 98.5 F (36.9 C) (Oral)   Ht 5' (1.524 m)   Wt 192 lb 9.6 oz (87.4 kg)   BMI 37.61 kg/m    Subjective:    Patient ID: Tiffany Odom, female    DOB: 11/15/1975, 45 y.o.   MRN: [id number]  HPI: Tiffany Odom is a 45 y.o. female  Chief Complaint  Patient presents with   Nasal Congestion    Patient is here for a follow up for her chest and nasal congestion.    UPPER RESPIRATORY TRACT INFECTION Ongoing cough since 04/07/21 -- was treated Augmentin and Prednisone, felt better for one day and then returned.  Flu and Covid were negative.  Children were not sick.  Fever: no Cough: yes Shortness of breath:  occasional after coughing spell Wheezing: no Chest pain: no Chest tightness: yes Chest congestion: yes Nasal congestion:  in morning Runny nose: yes Post nasal drip: yes Sneezing: no Sore throat: yes Swollen glands: no Sinus pressure: yes Headache: yes Face pain: no Toothache: no Ear pain: none Ear pressure: yes bilateral Eyes red/itching:no Eye drainage/crusting: no  Vomiting: no Rash: no Fatigue: yes Sick contacts: no Strep contacts: no  Context: stable Recurrent sinusitis: no Relief with OTC cold/cough medications: no  Treatments attempted:  Prednisone, cold/sinus, mucinex, anti-histamine, and antibiotics  -- Coricidin, NyQuil for high blood pressure  Relevant past medical, surgical, family and social history reviewed and updated as indicated. Interim medical history since our last visit reviewed. Allergies and medications reviewed and updated.  Review of Systems  Constitutional:  Positive for fatigue. Negative for activity change, appetite change and fever.  HENT:  Positive for congestion, postnasal drip, rhinorrhea, sinus pressure and sore throat. Negative for ear discharge, ear pain, facial swelling, sinus pain, sneezing and voice change.   Eyes:  Negative for pain and visual disturbance.  Respiratory:  Positive for cough  and chest tightness. Negative for shortness of breath and wheezing.   Cardiovascular:  Negative for chest pain, palpitations and leg swelling.  Neurological:  Positive for headaches. Negative for dizziness, weakness and numbness.  Psychiatric/Behavioral: Negative.     Per HPI unless specifically indicated above     Objective:    BP (!) 151/87   Pulse 71   Temp 98.5 F (36.9 C) (Oral)   Ht 5' (1.524 m)   Wt 192 lb 9.6 oz (87.4 kg)   BMI 37.61 kg/m   Wt Readings from Last 3 Encounters:  05/01/21 192 lb 9.6 oz (87.4 kg)  04/07/21 193 lb (87.5 kg)    Physical Exam Vitals and nursing note reviewed.  Constitutional:      General: She is awake. She is not in acute distress.    Appearance: She is well-developed and well-groomed. She is obese. She is not ill-appearing or toxic-appearing.  HENT:     Head: Normocephalic.     Right Ear: Hearing, ear canal and external ear normal. A middle ear effusion is present.     Left Ear: Hearing, ear canal and external ear normal. A middle ear effusion is present.     Nose: Rhinorrhea present. Rhinorrhea is clear.     Right Sinus: No maxillary sinus tenderness or frontal sinus tenderness.     Left Sinus: No maxillary sinus tenderness or frontal sinus tenderness.     Mouth/Throat:     Mouth: Mucous membranes are moist.     Pharynx: Oropharynx is clear. Posterior oropharyngeal erythema (mild with cobblestoning)  present. No pharyngeal swelling or oropharyngeal exudate.     Tonsils: No tonsillar exudate. 2+ on the right. 2+ on the left.  Eyes:     General: Lids are normal.        Right eye: No discharge.        Left eye: No discharge.     Conjunctiva/sclera: Conjunctivae normal.     Pupils: Pupils are equal, round, and reactive to light.  Neck:     Thyroid: No thyromegaly.     Vascular: No carotid bruit.  Cardiovascular:     Rate and Rhythm: Normal rate and regular rhythm.     Heart sounds: Normal heart sounds. No murmur heard.   No gallop.   Pulmonary:     Effort: Pulmonary effort is normal. No accessory muscle usage or respiratory distress.     Breath sounds: Normal breath sounds.     Comments: Clear throughout with intermittent non productive cough noted. Abdominal:     General: Bowel sounds are normal.     Palpations: Abdomen is soft. There is no hepatomegaly or splenomegaly.  Musculoskeletal:     Cervical back: Normal range of motion and neck supple.     Right lower leg: No edema.     Left lower leg: No edema.  Lymphadenopathy:     Cervical: No cervical adenopathy.  Skin:    General: Skin is warm and dry.  Neurological:     Mental Status: She is alert and oriented to person, place, and time.     Deep Tendon Reflexes: Reflexes are normal and symmetric.     Reflex Scores:      Brachioradialis reflexes are 2+ on the right side and 2+ on the left side.      Patellar reflexes are 2+ on the right side and 2+ on the left side. Psychiatric:        Attention and Perception: Attention normal.        Mood and Affect: Mood normal.        Speech: Speech normal.        Behavior: Behavior normal. Behavior is cooperative.        Thought Content: Thought content normal.   Results for orders placed or performed in visit on 04/07/21  Novel Coronavirus, NAA ([facility])   Specimen: Nasopharyngeal(NP) swabs in vial transport medium  Result Value Ref Range   SARS-CoV-2, NAA Not Detected Not Detected  SARS-COV-2, NAA 2 DAY TAT  Result Value Ref Range   SARS-CoV-2, NAA 2 DAY TAT Performed   Bayer DCA Hb A1c Waived  Result Value Ref Range   HB A1C (BAYER DCA - WAIVED) 6.4 (H) 4.8 - 5.6 %  Comprehensive metabolic panel  Result Value Ref Range   Glucose 104 (H) 70 - 99 mg/dL   BUN 17 6 - 24 mg/dL   Creatinine, Ser 0.91 0.57 - 1.00 mg/dL   eGFR 80 >59 mL/min/1.73   BUN/Creatinine Ratio 19 9 - 23   Sodium 136 134 - 144 mmol/L   Potassium 4.0 3.5 - 5.2 mmol/L   Chloride 98 96 - 106 mmol/L   CO2 24 20 - 29 mmol/L   Calcium 9.6 8.7  - 10.2 mg/dL   Total Protein 7.0 6.0 - 8.5 g/dL   Albumin 4.4 3.8 - 4.8 g/dL   Globulin, Total 2.6 1.5 - 4.5 g/dL   Albumin/Globulin Ratio 1.7 1.2 - 2.2   Bilirubin Total 0.6 0.0 - 1.2 mg/dL   Alkaline Phosphatase 103 44 - 121  IU/L   AST 20 0 - 40 IU/L   ALT 24 0 - 32 IU/L  Lipid Panel w/o Chol/HDL Ratio  Result Value Ref Range   Cholesterol, Total 226 (H) 100 - 199 mg/dL   Triglycerides 443 (H) 0 - 149 mg/dL   HDL 27 (L) >39 mg/dL   VLDL Cholesterol Cal 79 (H) 5 - 40 mg/dL   LDL Chol Calc (NIH) 120 (H) 0 - 99 mg/dL  TSH  Result Value Ref Range   TSH 1.340 0.450 - 4.500 uIU/mL  CBC with Differential/Platelet  Result Value Ref Range   WBC 9.5 3.4 - 10.8 x10E3/uL   RBC 4.50 3.77 - 5.28 x10E6/uL   Hemoglobin 13.8 11.1 - 15.9 g/dL   Hematocrit 40.2 34.0 - 46.6 %   MCV 89 79 - 97 fL   MCH 30.7 26.6 - 33.0 pg   MCHC 34.3 31.5 - 35.7 g/dL   RDW 11.8 11.7 - 15.4 %   Platelets 362 150 - 450 x10E3/uL   Neutrophils 62 Not Estab. %   Lymphs 29 Not Estab. %   Monocytes 6 Not Estab. %   Eos 2 Not Estab. %   Basos 1 Not Estab. %   Neutrophils Absolute 5.9 1.4 - 7.0 x10E3/uL   Lymphocytes Absolute 2.7 0.7 - 3.1 x10E3/uL   Monocytes Absolute 0.6 0.1 - 0.9 x10E3/uL   EOS (ABSOLUTE) 0.2 0.0 - 0.4 x10E3/uL   Basophils Absolute 0.1 0.0 - 0.2 x10E3/uL   Immature Granulocytes 0 Not Estab. %   Immature Grans (Abs) 0.0 0.0 - 0.1 x10E3/uL  Microalbumin, Urine Waived  Result Value Ref Range   Microalb, Ur Waived 10 0 - 19 mg/L   Creatinine, Urine Waived 50 10 - 300 mg/dL   Microalb/Creat Ratio <30 <30 mg/g  Veritor Flu A/B Waived  Result Value Ref Range   Influenza A Negative Negative   Influenza B Negative Negative      Assessment & Plan:   Problem List Items Addressed This Visit       Other   Cough, persistent    Ongoing post sinus infection 04/07/21 with no improvement after Augmentin and Prednisone 5 days.  Was flu and Covid negative on testing.  At this time will obtain imaging  CXR, discussed with patient were to obtain.  Start Doxycycline 100 MG BID for 7 days, Prednisone 40 MG daily for 5 days, and Albuterol inhaler as needed.  Recommend she continue all simple treatment at home with Coricidin, Nyquil HTN, and throat lozenges.  Will plan to have her return in one week for follow-up.        Follow up plan: Return in about 1 week (around 05/08/2021) for Cough.

## 2021-05-01 NOTE — Patient Instructions (Addendum)
[facility] -- [address] [place], [place] [address] -- [phone]  Cough, Adult A cough helps to clear your throat and lungs. A cough may be a sign of an illness or another medical condition. An acute cough may only last 2-3 weeks, while a chronic cough may last 8 or more weeks. Many things can cause a cough. They include: Germs (viruses or bacteria) that attack the airway. Breathing in things that bother (irritate) your lungs. Allergies. Asthma. Mucus that runs down the back of your throat (postnasal drip). Smoking. Acid backing up from the stomach into the tube that moves food from the mouth to the stomach (gastroesophageal reflux). Some medicines. Lung problems. Other medical conditions, such as heart failure or a blood clot in the lung (pulmonary embolism). Follow these instructions at home: Medicines Take over-the-counter and prescription medicines only as told by your doctor. Talk with your doctor before you take medicines that stop a cough (cough suppressants). Lifestyle  Do not smoke, and try not to be around smoke. Do not use any products that contain nicotine or tobacco, such as cigarettes, e-cigarettes, and chewing tobacco. If you need help quitting, ask your doctor. Drink enough fluid to keep your pee (urine) pale yellow. Avoid caffeine. Do not drink alcohol if your doctor tells you not to drink. General instructions  Watch for any changes in your cough. Tell your doctor about them. Always cover your mouth when you cough. Stay away from things that make you cough, such as perfume, candles, campfire smoke, or cleaning products. If the air is dry, use a cool mist vaporizer or humidifier in your home. If your cough is worse at night, try using extra pillows to raise your head up higher while you sleep. Rest as needed. Keep all follow-up visits as told by your doctor. This is important. Contact a doctor if: You have new symptoms. You cough up pus. Your  cough does not get better after 2-3 weeks, or your cough gets worse. Cough medicine does not help your cough and you are not sleeping well. You have pain that gets worse or pain that is not helped with medicine. You have a fever. You are losing weight and you do not know why. You have night sweats. Get help right away if: You cough up blood. You have trouble breathing. Your heartbeat is very fast. These symptoms may be an emergency. Do not wait to see if the symptoms will go away. Get medical help right away. Call your local emergency services (911 in the [place] Do not drive yourself to the hospital. Summary A cough helps to clear your throat and lungs. Many things can cause a cough. Take over-the-counter and prescription medicines only as told by your doctor. Always cover your mouth when you cough. Contact a doctor if you have new symptoms or you have a cough that does not get better or gets worse. This information is not intended to replace advice given to you by your health care provider. Make sure you discuss any questions you have with your health care provider. Document Revised: 08/03/2019 Document Reviewed: 07/03/2018 Elsevier Patient Education  2022 [employer].

## 2021-05-01 NOTE — Assessment & Plan Note (Signed)
Ongoing post sinus infection 04/07/21 with no improvement after Augmentin and Prednisone 5 days.  Was flu and Covid negative on testing.  At this time will obtain imaging CXR, discussed with patient were to obtain.  Start Doxycycline 100 MG BID for 7 days, Prednisone 40 MG daily for 5 days, and Albuterol inhaler as needed.  Recommend she continue all simple treatment at home with Coricidin, Nyquil HTN, and throat lozenges.  Will plan to have her return in one week for follow-up.

## 2021-05-04 ENCOUNTER — Ambulatory Visit
Admission: RE | Admit: 2021-05-04 | Discharge: 2021-05-04 | Disposition: A | Discharge status: Home / Self Care | Payer: BC Managed Care – PPO | Attending: Nurse Practitioner | Admitting: Nurse Practitioner

## 2021-05-04 ENCOUNTER — Ambulatory Visit
Admission: RE | Admit: 2021-05-04 | Discharge: 2021-05-04 | Disposition: A | Discharge status: Home / Self Care | Payer: BC Managed Care – PPO | Source: Ambulatory Visit | Attending: Nurse Practitioner | Admitting: Nurse Practitioner

## 2021-05-04 ENCOUNTER — Other Ambulatory Visit: Payer: Self-pay

## 2021-05-04 ENCOUNTER — Telehealth: Payer: Self-pay | Admitting: Nurse Practitioner

## 2021-05-04 DIAGNOSIS — R053 Chronic cough: Secondary | ICD-10-CM | POA: Insufficient documentation

## 2021-05-04 DIAGNOSIS — R059 Cough, unspecified: Secondary | ICD-10-CM | POA: Diagnosis not present

## 2021-05-04 NOTE — Progress Notes (Signed)
Contacted via MyChart  Good evening Caralynn, your chest imaging has returned.  I have viewed both imaging and report, no pneumonia noted.  Overall looks good.  Any questions? Keep being awesome!!  Thank you for allowing me to participate in your care.  I appreciate you. Kindest regards, Kaleea Penner

## 2021-05-04 NOTE — Telephone Encounter (Signed)
Called patient to inform where she should her X-ray done

## 2021-05-04 NOTE — Addendum Note (Signed)
Addended by: Aura Dials T on: 05/04/2021 11:36 AM   Modules accepted: Orders

## 2021-05-04 NOTE — Telephone Encounter (Signed)
Pt would like to know where she should go for her chest x-ray that is scheduled for tomorrow. She is able to go today. Please advise

## 2021-05-08 ENCOUNTER — Encounter: Payer: Self-pay | Admitting: Nurse Practitioner

## 2021-05-08 ENCOUNTER — Telehealth (INDEPENDENT_AMBULATORY_CARE_PROVIDER_SITE_OTHER): Payer: BC Managed Care – PPO | Admitting: Nurse Practitioner

## 2021-05-08 DIAGNOSIS — R053 Chronic cough: Secondary | ICD-10-CM | POA: Diagnosis not present

## 2021-05-08 MED ORDER — DOXYCYCLINE HYCLATE 100 MG PO TABS: 100.0000 mg | ORAL_TABLET | Freq: Two times a day (BID) | ORAL | 0 refills | Status: AC

## 2021-05-08 NOTE — Progress Notes (Signed)
LMOM for pt to call the office and schedule an appt.for a fu for cough//ip

## 2021-05-08 NOTE — Patient Instructions (Signed)
Respiratory Syncytial Virus Infection, Adult °Respiratory syncytial virus (RSV) infection is an infection caused by RSV, a common virus. This virus is similar to viruses that cause the common cold and the flu. RSV infection can affect the nose, throat, windpipe, and lungs (respiratory system). When the infection is severe, it can cause: °Bronchiolitis. This condition causes inflammation of the air passages in the lungs (bronchioles). °Pneumonia. This condition causes inflammation of the air sacs in the lungs. °RSV infection spreads from person to person (is contagious) through droplets from coughs and sneezes (respiratory secretions). This condition is rarely serious when it occurs in adults. °What are the causes? °This condition is caused by contact with RSV. This can happen by: °Breathing respiratory secretions from someone who has the infection. °Touching something that has been exposed to the virus (is contaminated) and then touching your mouth, nose, or eyes. °Coming in close contact with someone who has this infection. This may happen if you: °Hug or kiss. °Shake or hold hands. °Eat or drink using the same dishes or utensils. °What increases the risk? °The following factors may make you more likely to develop this condition: °Being 65 years of age or older. °Having certain health conditions, including: °A long-term (chronic) lung condition, such as chronic obstructive pulmonary disease (COPD). °An immune system that is weak. This is your body's defense system. °Down syndrome. °Heart disease. °Working in a hospital or other health care facility. °Living in a long-term health care facility. °RSV infections are most common from the months of November to April, but they can happen any time of year. °What are the signs or symptoms? °Symptoms of this condition include: °Having a runny nose. °Coughing. You may have a cough that brings up mucus (productive cough). °Sneezing. °Having a fever. °Wanting to eat less than  usual. °Breathing loudly (wheezing). °Having shortness of breath. °Having fluid build up in the lungs (respiratory distress). °How is this diagnosed? °This condition may be diagnosed based on: °Your symptoms. °Your medical history. °A physical exam. °A chest X-ray to rule out pneumonia. °Blood tests or tests of mucus from your lungs (sputum). These tests may be done for older adults. °A test of a sample of your respiratory secretions. °How is this treated? °In most cases, the RSV infection will go away after 1-2 weeks of caring for yourself at home.  °Sometimes, RSV infection is severe and can cause bronchiolitis or pneumonia. If you develop one or both of these conditions, you may need to be treated in the hospital. You may be given: °Oxygen therapy. °Antiviral medicine. °Medicines to open your bronchioles (bronchodilators). °Follow these instructions at home: °Medicines °Take over-the-counter and prescription medicines only as told by your health care provider. °If you were prescribed an antiviral medicine, take it as told by your health care provider. Do not stop using the antiviral even if you start to feel better. °Lifestyle ° °Eat a healthy diet. °Do not drink alcohol. °Do not use any products that contain nicotine or tobacco, such as cigarettes, e-cigarettes, and chewing tobacco. If you need help quitting, ask your health care provider. °Rest at home until your symptoms go away. °Return to your normal activities as told by your health care provider. Ask your health care provider what activities are safe for you. °General instructions ° °Drink enough fluid to keep your urine pale yellow. °Gargle with a salt-water mixture 3-4 times a day or as needed. To make a salt-water mixture, completely dissolve ½-1 tsp (3-6 g) of salt in 1   cup (237 mL) of warm water. °Keep all follow-up visits as told by your health care provider. This is important. °How is this prevented? °To prevent catching and spreading RSV: °Wash  your hands often with soap and water for at least 20 seconds. If soap and water are not available, use hand sanitizer. Do not touch your face without first cleaning your hands. °Stay home if you have symptoms of the common cold or the flu. °Cover your nose and mouth when you cough or sneeze. °Avoid large groups of people. °Keep a safe distance of about 6 feet (1.8 m) from people who are coughing or sneezing. °Where to find more information °Centers for Disease Control and Prevention: www.cdc.gov °Contact a health care provider if: °Your symptoms get worse or have not changed after 2 weeks. °You have: °A fever. °Hot flashes, sweating, or chills that keep happening. °A cough that brings up much more mucus than usual. °A cough that brings up blood. °You feel: °Very tired (lethargic). °Confused. °Get help right away if: °You have increased or severe trouble breathing. °You lose consciousness. °These symptoms may represent a serious problem that is an emergency. Do not wait to see if the symptoms will go away. Get medical help right away. Call your local emergency services (911 in the [place] Do not drive yourself to the hospital. °Summary °Respiratory syncytial virus (RSV) infection is an infection caused by RSV, a common virus. RSV infection can affect the nose, throat, windpipe, and lungs (respiratory system). °When the infection is severe, it can cause bronchiolitis or pneumonia. °Take over-the-counter and prescription medicines only as told by your health care provider. °Contact a health care provider if your symptoms get worse or have not changed after 2 weeks. °This information is not intended to replace advice given to you by your health care provider. Make sure you discuss any questions you have with your health care provider. °Document Revised: 04/04/2019 Document Reviewed: 04/04/2019 °Elsevier Patient Education © 2022 [employer] ° °

## 2021-05-08 NOTE — Progress Notes (Signed)
There were no vitals taken for this visit.   Subjective:    Patient ID: Tiffany Odom, female    DOB: Jul 04, 1975, 45 y.o.   MRN: [id number]  HPI: Tiffany Odom is a 45 y.o. female  Chief Complaint  Patient presents with   Cough    Patient is here for a follow up on a cough. Patient states she has the lingering cough and states the new medication may be loosening things up, but it is still has not cleared up. Patient states she has one more day on the medication.    This visit was completed via telephone due to the restrictions of the COVID-19 pandemic. All issues as above were discussed and addressed but no physical exam was performed. If it was felt that the patient should be evaluated in the office, they were directed there. The patient verbally consented to this visit. Patient was unable to complete an audio/visual visit due to Technical difficulties", "Lack of internet. Due to the catastrophic nature of the COVID-19 pandemic, this visit was done through audio contact only. Location of the patient: home Location of the provider: work Those involved with this call:  Provider: Marnee Guarneri, DNP CMA: Irena Reichmann, [place] Desk/Registration: [employer]  Time spent on call:  21 minutes on the phone discussing health concerns. 15 minutes total spent in review of patient's record and preparation of their chart.  I verified patient identity using two factors (patient name and date of birth). Patient consents verbally to being seen via telemedicine visit today.    UPPER RESPIRATORY TRACT INFECTION Follow-up for cough.  Ongoing cough since 04/07/21 -- was treated Augmentin and Prednisone, felt better for one day and then returned.  Flu and Covid were negative.  Children were not sick. Changed to Doxycycline 100 MG BID for 7 days last visit and Prednisone 40 MG x 5 days + changed her Lisinopril to ARB (Avapro).  Recent CXR on 05/04/21 showed no abnormal findings or acute issues.  Albuterol inhaler does help when coughing spells.  She has had several family members with flu and RSV.  She reports cough has not improved 100%, it is less consistent. Fever: no Cough: yes Shortness of breath:  occasional after coughing spell Wheezing: no Chest pain: no Chest tightness: yes Chest congestion: yes Nasal congestion:  none Runny nose: none Post nasal drip: none Sneezing: no Sore throat: none Swollen glands: no Sinus pressure: none Headache: yes Face pain: no Toothache: no Ear pain: none Ear pressure: none Eyes red/itching:no Eye drainage/crusting: no  Vomiting: no Rash: no Fatigue: yes Sick contacts: no Strep contacts: no  Context: stable Recurrent sinusitis: no Relief with OTC cold/cough medications: no  Treatments attempted:  Prednisone, cold/sinus, mucinex, anti-histamine, and antibiotics  -- Coricidin, NyQuil for high blood pressure   Relevant past medical, surgical, family and social history reviewed and updated as indicated. Interim medical history since our last visit reviewed. Allergies and medications reviewed and updated.  Review of Systems  Constitutional:  Positive for fatigue. Negative for activity change, appetite change and fever.  HENT: Negative.    Eyes:  Negative for pain.  Respiratory:  Positive for cough and chest tightness. Negative for shortness of breath and wheezing.   Cardiovascular:  Negative for chest pain, palpitations and leg swelling.  Neurological:  Negative for dizziness, weakness, numbness and headaches.  Psychiatric/Behavioral: Negative.     Per HPI unless specifically indicated above     Objective:    There were no vitals  taken for this visit.  Wt Readings from Last 3 Encounters:  05/01/21 192 lb 9.6 oz (87.4 kg)  04/07/21 193 lb (87.5 kg)    Physical Exam  Unable to perform due to telephone visit only, patient unable to connect to video.  Results for orders placed or performed in visit on 04/07/21  Novel  Coronavirus, NAA ([facility])   Specimen: Nasopharyngeal(NP) swabs in vial transport medium  Result Value Ref Range   SARS-CoV-2, NAA Not Detected Not Detected  SARS-COV-2, NAA 2 DAY TAT  Result Value Ref Range   SARS-CoV-2, NAA 2 DAY TAT Performed   Bayer DCA Hb A1c Waived  Result Value Ref Range   HB A1C (BAYER DCA - WAIVED) 6.4 (H) 4.8 - 5.6 %  Comprehensive metabolic panel  Result Value Ref Range   Glucose 104 (H) 70 - 99 mg/dL   BUN 17 6 - 24 mg/dL   Creatinine, Ser 0.91 0.57 - 1.00 mg/dL   eGFR 80 >59 mL/min/1.73   BUN/Creatinine Ratio 19 9 - 23   Sodium 136 134 - 144 mmol/L   Potassium 4.0 3.5 - 5.2 mmol/L   Chloride 98 96 - 106 mmol/L   CO2 24 20 - 29 mmol/L   Calcium 9.6 8.7 - 10.2 mg/dL   Total Protein 7.0 6.0 - 8.5 g/dL   Albumin 4.4 3.8 - 4.8 g/dL   Globulin, Total 2.6 1.5 - 4.5 g/dL   Albumin/Globulin Ratio 1.7 1.2 - 2.2   Bilirubin Total 0.6 0.0 - 1.2 mg/dL   Alkaline Phosphatase 103 44 - 121 IU/L   AST 20 0 - 40 IU/L   ALT 24 0 - 32 IU/L  Lipid Panel w/o Chol/HDL Ratio  Result Value Ref Range   Cholesterol, Total 226 (H) 100 - 199 mg/dL   Triglycerides 443 (H) 0 - 149 mg/dL   HDL 27 (L) >39 mg/dL   VLDL Cholesterol Cal 79 (H) 5 - 40 mg/dL   LDL Chol Calc (NIH) 120 (H) 0 - 99 mg/dL  TSH  Result Value Ref Range   TSH 1.340 0.450 - 4.500 uIU/mL  CBC with Differential/Platelet  Result Value Ref Range   WBC 9.5 3.4 - 10.8 x10E3/uL   RBC 4.50 3.77 - 5.28 x10E6/uL   Hemoglobin 13.8 11.1 - 15.9 g/dL   Hematocrit 40.2 34.0 - 46.6 %   MCV 89 79 - 97 fL   MCH 30.7 26.6 - 33.0 pg   MCHC 34.3 31.5 - 35.7 g/dL   RDW 11.8 11.7 - 15.4 %   Platelets 362 150 - 450 x10E3/uL   Neutrophils 62 Not Estab. %   Lymphs 29 Not Estab. %   Monocytes 6 Not Estab. %   Eos 2 Not Estab. %   Basos 1 Not Estab. %   Neutrophils Absolute 5.9 1.4 - 7.0 x10E3/uL   Lymphocytes Absolute 2.7 0.7 - 3.1 x10E3/uL   Monocytes Absolute 0.6 0.1 - 0.9 x10E3/uL   EOS (ABSOLUTE) 0.2 0.0 - 0.4  x10E3/uL   Basophils Absolute 0.1 0.0 - 0.2 x10E3/uL   Immature Granulocytes 0 Not Estab. %   Immature Grans (Abs) 0.0 0.0 - 0.1 x10E3/uL  Microalbumin, Urine Waived  Result Value Ref Range   Microalb, Ur Waived 10 0 - 19 mg/L   Creatinine, Urine Waived 50 10 - 300 mg/dL   Microalb/Creat Ratio <30 <30 mg/g  Veritor Flu A/B Waived  Result Value Ref Range   Influenza A Negative Negative   Influenza B Negative Negative  Assessment & Plan:   Problem List Items Addressed This Visit       Other   Cough, persistent    Ongoing post sinus infection 04/07/21 with some improvement at this time, but not 100%.  Was flu and Covid negative on testing + imaging negative.  Extend Doxycycline 100 MG BID for 7 days and conitnue Albuterol inhaler as needed.  Add on OTC Famotidine daily.  Recommend she continue all simple treatment at home with Coricidin, Nyquil HTN, and throat lozenges.  Will plan to have her return in two weeks for follow-up.       I discussed the assessment and treatment plan with the patient. The patient was provided an opportunity to ask questions and all were answered. The patient agreed with the plan and demonstrated an understanding of the instructions.   The patient was advised to call back or seek an in-person evaluation if the symptoms worsen or if the condition fails to improve as anticipated.   I provided 21+ minutes of time during this encounter.   Follow up plan: Return in about 2 weeks (around 05/22/2021) for Cough.

## 2021-05-08 NOTE — Assessment & Plan Note (Signed)
Ongoing post sinus infection 04/07/21 with some improvement at this time, but not 100%.  Was flu and Covid negative on testing + imaging negative.  Extend Doxycycline 100 MG BID for 7 days and conitnue Albuterol inhaler as needed.  Add on OTC Famotidine daily.  Recommend she continue all simple treatment at home with Coricidin, Nyquil HTN, and throat lozenges.  Will plan to have her return in two weeks for follow-up.

## 2021-05-25 ENCOUNTER — Ambulatory Visit: Payer: BC Managed Care – PPO | Admitting: Nurse Practitioner

## 2021-06-13 DIAGNOSIS — R519 Headache, unspecified: Secondary | ICD-10-CM | POA: Diagnosis not present

## 2021-06-13 DIAGNOSIS — Z20822 Contact with and (suspected) exposure to covid-19: Secondary | ICD-10-CM | POA: Diagnosis not present

## 2021-06-13 DIAGNOSIS — J029 Acute pharyngitis, unspecified: Secondary | ICD-10-CM | POA: Diagnosis not present

## 2021-06-14 ENCOUNTER — Encounter: Payer: Self-pay | Admitting: Nurse Practitioner

## 2021-06-15 ENCOUNTER — Ambulatory Visit: Payer: BC Managed Care – PPO | Admitting: Nurse Practitioner

## 2021-07-01 ENCOUNTER — Other Ambulatory Visit: Payer: Self-pay

## 2021-07-01 ENCOUNTER — Encounter: Payer: Self-pay | Admitting: Neurology

## 2021-07-01 ENCOUNTER — Ambulatory Visit (INDEPENDENT_AMBULATORY_CARE_PROVIDER_SITE_OTHER): Payer: BC Managed Care – PPO | Admitting: Neurology

## 2021-07-01 VITALS — BP 130/85 | HR 80 | Ht 61.0 in | Wt 191.2 lb

## 2021-07-01 DIAGNOSIS — Z820 Family history of epilepsy and other diseases of the nervous system: Secondary | ICD-10-CM

## 2021-07-01 DIAGNOSIS — R519 Headache, unspecified: Secondary | ICD-10-CM

## 2021-07-01 DIAGNOSIS — R0683 Snoring: Secondary | ICD-10-CM

## 2021-07-01 DIAGNOSIS — G4719 Other hypersomnia: Secondary | ICD-10-CM | POA: Diagnosis not present

## 2021-07-01 DIAGNOSIS — E669 Obesity, unspecified: Secondary | ICD-10-CM

## 2021-07-01 NOTE — Patient Instructions (Signed)

## 2021-07-01 NOTE — Progress Notes (Signed)
Subjective:    Patient ID: [place] is a 46 y.o. female.  HPI    Star Age, MD, PhD [facility] [address], [address]O. [place], [place] [address]  Dear Henrine Screws,   I saw your patient, Farhiya Rosten, upon your kind request in my sleep clinic today for initial consultation of her sleep disorder, in particular, concern for underlying obstructive sleep apnea.  The patient is unaccompanied today.  As you know, Ms. Greenfield is a 46 year old right-handed woman with an underlying medical history of diabetes, hypertension, depression, hyperlipidemia, and obesity, who reports snoring and excessive daytime somnolence.  I reviewed your office note from 04/07/2021.  Her Epworth sleepiness score is 18 out of 24, fatigue severity score is 49 out of 63.  Her mom has sleep apnea and has a CPAP machine, father was also diagnosed with sleep apnea.  He passed away in 13-Aug-2018.  Patient lives with her mom and 2 children, ages 45 and 74.  She has occasionally woken up with a headache, has a history of migraines but her morning headaches are more dull and achy typically.  Bedtime is generally between 830 and 9 PM.  She has a TV in her bedroom and watches TV until approximately 9 PM.  Rise time is 5:15 AM.  She has had different types of shift work.  She used to work second shift from 3 PM to midnight or 1 AM.  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  She is a non-smoker and drinks caffeine in the form of an energy drink once in the morning.  She drinks alcohol very rarely, on special occasions only.  Weight has been more or less stable.  She has a history of sleep talking, no history of sleepwalking.  She does not have night to night nocturia.  Her Past Medical History Is Significant For: Past Medical History:  Diagnosis Date   Depression    Diabetes ([place])    Hyperlipemia    Hypertension     Her Past Surgical History Is Significant For: Past  Surgical History:  Procedure Laterality Date   ABDOMINAL HYSTERECTOMY     BILATERAL CARPAL TUNNEL RELEASE     CESAREAN SECTION     CHOLECYSTECTOMY      Her Family History Is Significant For: Family History  Problem Relation Age of Onset   Diabetes Mother    ADD / ADHD Mother    Heart Problems Mother    Sleep apnea Mother    Diabetes Father    Heart Problems Father    Healthy Sister    Healthy Sister    Healthy Sister    Healthy Sister    Healthy Sister    Healthy Brother    Heart Problems Maternal Grandmother    Diabetes Maternal Grandmother    Heart Problems Paternal Grandmother    Diabetes Paternal Grandmother     Her Social History Is Significant For: Social History   Socioeconomic History   Marital status: Divorced    Spouse name: Not on file   Number of children: Not on file   Years of education: Not on file   Highest education level: Not on file  Occupational History   Not on file  Tobacco Use   Smoking status: Never   Smokeless tobacco: Never  Vaping Use   Vaping Use: Never used  Substance and Sexual Activity   Alcohol use: Yes    Comment:  OCC   Drug use: Not on file   Sexual activity: Not on file  Other Topics Concern   Not on file  Social History Narrative   Not on file   Social Determinants of Health   Financial Resource Strain: Low Risk    Difficulty of Paying Living Expenses: Not hard at all  Food Insecurity: No Food Insecurity   Worried About Running Out of Food in the Last Year: Never true   Ran Out of Food in the Last Year: Never true  Transportation Needs: No Transportation Needs   Lack of Transportation (Medical): No   Lack of Transportation (Non-Medical): No  Physical Activity: Insufficiently Active   Days of Exercise per Week: 4 days   Minutes of Exercise per Session: 30 min  Stress: No Stress Concern Present   Feeling of Stress : Only a little  Social Connections: Socially Isolated   Frequency of Communication with Friends and  Family: Three times a week   Frequency of Social Gatherings with Friends and Family: Three times a week   Attends Religious Services: Never   Active Member of Clubs or Organizations: Not on file   Attends Club or Organization Meetings: Never   Marital Status: Separated    Her Allergies Are:  Allergies  Allergen Reactions   Oxycodone-Acetaminophen Rash    Overly sedated on Percocet at time of lithotripsy 2010   Sulfamethoxazole-Trimethoprim Rash    Drug rash January 2017   Lisinopril Cough   Rosuvastatin Other (See Comments)   Cyclobenzaprine Rash    "rasied Blood pressure"   :   Her Current Medications Are:  Outpatient Encounter Medications as of 07/01/2021  Medication Sig   albuterol (VENTOLIN HFA) 108 (90 Base) MCG/ACT inhaler Inhale 2 puffs into the lungs every 6 (six) hours as needed for wheezing or shortness of breath.   atorvastatin (LIPITOR) 80 MG tablet Take by mouth.   Blood Glucose Monitoring Suppl (ONETOUCH VERIO) w/Device KIT Use 1-4 times daily as needed/directed DX E11.9   FLUoxetine (PROZAC) 20 MG capsule Take by mouth.   irbesartan (AVAPRO) 150 MG tablet Take by mouth.   Lancets (ONETOUCH ULTRASOFT) lancets Use as instructed   ONETOUCH VERIO test strip Use 1-4 times daily as directed/needed   DX E11.9   No facility-administered encounter medications on file as of 07/01/2021.  :   Review of Systems:  Out of a complete 14 point review of systems, all are reviewed and negative with the exception of these symptoms as listed below:   Review of Systems  Neurological:        Pt is here for sleep consult. Pt states she snores,has Hypertension,Fatigue throughout the day and headaches . Pt denies sleep study and CPAP machine   ESS: 18 FSS:49   Objective:  Neurological Exam  Physical Exam Physical Examination:   Vitals:   07/01/21 1514  BP: 130/85  Pulse: 80    General Examination: The patient is a very pleasant 46 y.o. female in no acute distress. She  appears well-developed and well-nourished and well groomed.   HEENT: Normocephalic, atraumatic, pupils are equal, round and reactive to light, extraocular tracking is good without limitation to gaze excursion or nystagmus noted. Hearing is grossly intact. Face is symmetric with normal facial animation. Speech is clear with no dysarthria noted. There is no hypophonia. There is no lip, neck/head, jaw or voice tremor. Neck is supple with full range of passive and active motion. There are no carotid bruits on auscultation. Oropharynx  exam reveals: mild mouth dryness, adequate dental hygiene and moderate airway crowding, due to tonsillar size of about 3 mm, prominent uvula, Mallampati class II.  Neck circumference of 16-1/4 inches.  Tongue protrudes centrally and palate elevates symmetrically.  She has a minimal overbite.   Chest: Clear to auscultation without wheezing, rhonchi or crackles noted.  Heart: S1+S2+0, regular and normal without murmurs, rubs or gallops noted.   Abdomen: Soft, non-tender and non-distended with normal bowel sounds appreciated on auscultation.  Extremities: There is no pitting edema in the distal lower extremities bilaterally.   Skin: Warm and dry without trophic changes noted.   Musculoskeletal: exam reveals no obvious joint deformities, tenderness or joint swelling or erythema.   Neurologically:  Mental status: The patient is awake, alert and oriented in all 4 spheres. Her immediate and remote memory, attention, language skills and fund of knowledge are appropriate. There is no evidence of aphasia, agnosia, apraxia or anomia. Speech is clear with normal prosody and enunciation. Thought process is linear. Mood is normal and affect is normal.  Cranial nerves II - XII are as described above under HEENT exam.  Motor exam: Normal bulk, strength and tone is noted. There is no tremor. Fine motor skills and coordination: grossly intact.  Cerebellar testing: No dysmetria or  intention tremor. There is no truncal or gait ataxia.  Sensory exam: intact to light touch in the upper and lower extremities.  Gait, station and balance: She stands easily. No veering to one side is noted. No leaning to one side is noted. Posture is age-appropriate and stance is narrow based. Gait shows normal stride length and normal pace. No problems turning are noted.   Assessment and Plan:  In summary, Ashyla Luth is a very pleasant 46 y.o.-year old female with an underlying medical history of diabetes, hypertension, depression, hyperlipidemia, and obesity, whose history and physical exam are concerning for obstructive sleep apnea (OSA). I had a long chat with the patient about my findings and the diagnosis of OSA, its prognosis and treatment options. We talked about medical treatments, surgical interventions and non-pharmacological approaches. I explained in particular the risks and ramifications of untreated moderate to severe OSA, especially with respect to developing cardiovascular disease down the Road, including congestive heart failure, difficult to treat hypertension, cardiac arrhythmias, or stroke. Even type 2 diabetes has, in part, been linked to untreated OSA. Symptoms of untreated OSA include daytime sleepiness, memory problems, mood irritability and mood disorder such as depression and anxiety, lack of energy, as well as recurrent headaches, especially morning headaches. We talked about trying to maintain a healthy lifestyle in general, as well as the importance of weight control. We also talked about the importance of good sleep hygiene. I recommended the following at this time: sleep study.  I outlined the differences between a laboratory attended sleep study versus home sleep test.  I explained the sleep test procedure to the patient and also outlined possible surgical and non-surgical treatment options of OSA, including the use of a custom-made dental device (which would require a  referral to a specialist dentist or oral surgeon), upper airway surgical options, such as traditional UPPP or a novel less invasive surgical option in the form of Inspire hypoglossal nerve stimulation (which would involve a referral to an ENT surgeon). I also explained the CPAP treatment option to the patient, who indicated that she would be willing to try CPAP if the need arises. I answered all her questions today and the patient was in  agreement. I plan to see her back after the sleep study is completed and encouraged her to call with any interim questions, concerns, problems or updates.   Thank you very much for allowing me to participate in the care of this nice patient. If I can be of any further assistance to you please do not hesitate to call me at [phone].  Sincerely,   Star Age, MD, PhD

## 2021-07-20 ENCOUNTER — Telehealth: Payer: Self-pay | Admitting: Neurology

## 2021-07-20 NOTE — Telephone Encounter (Signed)
LVM for pt to call me back to schedule sleep study  

## 2021-08-02 NOTE — Patient Instructions (Addendum)
Please call to schedule your mammogram and/or bone density: °[facility]  °Address: [address], [place], [place] [address]  °Phone: [phone]  ° °Diabetes Mellitus and Nutrition, Adult °When you have diabetes, or diabetes mellitus, it is very important to have healthy eating habits because your blood sugar (glucose) levels are greatly affected by what you eat and drink. Eating healthy foods in the right amounts, at about the same times every day, can help you: °Manage your blood glucose. °Lower your risk of heart disease. °Improve your blood pressure. °Reach or maintain a healthy weight. °What can affect my meal plan? °Every person with diabetes is different, and each person has different needs for a meal plan. Your health care provider may recommend that you work with a dietitian to make a meal plan that is best for you. Your meal plan may vary depending on factors such as: °The calories you need. °The medicines you take. °Your weight. °Your blood glucose, blood pressure, and cholesterol levels. °Your activity level. °Other health conditions you have, such as heart or kidney disease. °How do carbohydrates affect me? °Carbohydrates, also called carbs, affect your blood glucose level more than any other type of food. Eating carbs raises the amount of glucose in your blood. °It is important to know how many carbs you can safely have in each meal. This is different for every person. Your dietitian can help you calculate how many carbs you should have at each meal and for each snack. °How does alcohol affect me? °Alcohol can cause a decrease in blood glucose (hypoglycemia), especially if you use insulin or take certain diabetes medicines by mouth. Hypoglycemia can be a life-threatening condition. Symptoms of hypoglycemia, such as sleepiness, dizziness, and confusion, are similar to symptoms of having too much alcohol. °Do not drink alcohol if: °Your health care provider  tells you not to drink. °You are pregnant, may be pregnant, or are planning to become pregnant. °If you drink alcohol: °Limit how much you have to: °0-1 drink a day for women. °0-2 drinks a day for men. °Know how much alcohol is in your drink. In the [place] one drink equals one 12 oz bottle of beer (355 mL), one 5 oz glass of wine (148 mL), or one 1½ oz glass of hard liquor (44 mL). °Keep yourself hydrated with water, diet soda, or unsweetened iced tea. Keep in mind that regular soda, juice, and other mixers may contain a lot of sugar and must be counted as carbs. °What are tips for following this plan? °Reading food labels °Start by checking the serving size on the Nutrition Facts label of packaged foods and drinks. The number of calories and the amount of carbs, fats, and other nutrients listed on the label are based on one serving of the item. Many items contain more than one serving per package. °Check the total grams (g) of carbs in one serving. °Check the number of grams of saturated fats and trans fats in one serving. Choose foods that have a low amount or none of these fats. °Check the number of milligrams (mg) of salt (sodium) in one serving. Most people should limit total sodium intake to less than 2,300 mg per day. °Always check the nutrition information of foods labeled as "low-fat" or "nonfat." These foods may be higher in added sugar or refined carbs and should be avoided. °Talk to your dietitian to identify your daily goals for nutrients listed on the label. °Shopping °Avoid buying   canned, pre-made, or processed foods. These foods tend to be high in fat, sodium, and added sugar. °Shop around the outside edge of the grocery store. This is where you will most often find fresh fruits and vegetables, bulk grains, fresh meats, and fresh dairy products. °Cooking °Use low-heat cooking methods, such as baking, instead of high-heat cooking methods, such as deep frying. °Cook using healthy oils, such as olive,  canola, or sunflower oil. °Avoid cooking with butter, cream, or high-fat meats. °Meal planning °Eat meals and snacks regularly, preferably at the same times every day. Avoid going long periods of time without eating. °Eat foods that are high in fiber, such as fresh fruits, vegetables, beans, and whole grains. °Eat 4-6 oz (112-168 g) of lean protein each day, such as lean meat, chicken, fish, eggs, or tofu. One ounce (oz) (28 g) of lean protein is equal to: °1 oz (28 g) of meat, chicken, or fish. °1 egg. °¼ cup (62 g) of tofu. °Eat some foods each day that contain healthy fats, such as avocado, nuts, seeds, and fish. °What foods should I eat? °Fruits °Berries. Apples. Oranges. Peaches. Apricots. Plums. Grapes. Mangoes. Papayas. Pomegranates. Kiwi. Cherries. °Vegetables °Leafy greens, including lettuce, spinach, kale, chard, collard greens, mustard greens, and cabbage. Beets. Cauliflower. Broccoli. Carrots. Green beans. Tomatoes. Peppers. Onions. Cucumbers. Brussels sprouts. °Grains °Whole grains, such as whole-wheat or whole-grain bread, crackers, tortillas, cereal, and pasta. Unsweetened oatmeal. Quinoa. Brown or wild rice. °Meats and other proteins °Seafood. Poultry without skin. Lean cuts of poultry and beef. Tofu. Nuts. Seeds. °Dairy °Low-fat or fat-free dairy products such as milk, yogurt, and cheese. °The items listed above may not be a complete list of foods and beverages you can eat and drink. Contact a dietitian for more information. °What foods should I avoid? °Fruits °Fruits canned with syrup. °Vegetables °Canned vegetables. Frozen vegetables with butter or cream sauce. °Grains °Refined white flour and flour products such as bread, pasta, snack foods, and cereals. Avoid all processed foods. °Meats and other proteins °Fatty cuts of meat. Poultry with skin. Breaded or fried meats. Processed meat. Avoid saturated fats. °Dairy °Full-fat yogurt, cheese, or milk. °Beverages °Sweetened drinks, such as soda or  iced tea. °The items listed above may not be a complete list of foods and beverages you should avoid. Contact a dietitian for more information. °Questions to ask a health care provider °Do I need to meet with a certified diabetes care and education specialist? °Do I need to meet with a dietitian? °What number can I call if I have questions? °When are the best times to check my blood glucose? °Where to find more information: °American Diabetes Association: diabetes.org °Academy of Nutrition and Dietetics: eatright.org °National Institute of Diabetes and Digestive and Kidney Diseases: niddk.nih.gov °Association of Diabetes Care & Education Specialists: diabeteseducator.org °Summary °It is important to have healthy eating habits because your blood sugar (glucose) levels are greatly affected by what you eat and drink. It is important to use alcohol carefully. °A healthy meal plan will help you manage your blood glucose and lower your risk of heart disease. °Your health care provider may recommend that you work with a dietitian to make a meal plan that is best for you. °This information is not intended to replace advice given to you by your health care provider. Make sure you discuss any questions you have with your health care provider. °Document Revised: 01/16/2020 Document Reviewed: 01/16/2020 °Elsevier Patient Education © 2022 Elsevier Inc. ° °

## 2021-08-03 ENCOUNTER — Ambulatory Visit (INDEPENDENT_AMBULATORY_CARE_PROVIDER_SITE_OTHER): Payer: BC Managed Care – PPO | Admitting: Neurology

## 2021-08-03 DIAGNOSIS — R0683 Snoring: Secondary | ICD-10-CM

## 2021-08-03 DIAGNOSIS — G4733 Obstructive sleep apnea (adult) (pediatric): Secondary | ICD-10-CM | POA: Diagnosis not present

## 2021-08-03 DIAGNOSIS — R519 Headache, unspecified: Secondary | ICD-10-CM

## 2021-08-03 DIAGNOSIS — E669 Obesity, unspecified: Secondary | ICD-10-CM

## 2021-08-03 DIAGNOSIS — Z820 Family history of epilepsy and other diseases of the nervous system: Secondary | ICD-10-CM

## 2021-08-03 DIAGNOSIS — G4719 Other hypersomnia: Secondary | ICD-10-CM

## 2021-08-04 NOTE — Progress Notes (Signed)
° °  [facility] ° °HOME SLEEP TEST (Watch PAT) REPORT ° °STUDY DATE: 08/03/2021 ° °DOB: 06/30/1975 ° °MRN: [id number] ° °ORDERING CLINICIAN: Lynnmarie Lovett, MD, PhD °  °REFERRING CLINICIAN: Cannady, Jolene T, NP  ° °CLINICAL INFORMATION/HISTORY: 46-year-old right-handed woman with an underlying medical history of diabetes, hypertension, depression, hyperlipidemia, and obesity, who reports snoring and excessive daytime somnolence.  ° °Epworth sleepiness score: 18/24. ° °BMI: 36.2 kg/m² ° °FINDINGS:  ° °Sleep Summary:  ° °Total Recording Time (hours, min): 9 hours, 36 minutes ° °Total Sleep Time (hours, min):  8 hours, 40 minutes  ° °Percent REM (%):    23.4%  ° °Respiratory Indices:  ° °Calculated pAHI (per hour):  22.8/hour        ° °REM pAHI:    33.7/hour      ° °NREM pAHI: 19.4/hour ° °Oxygen Saturation Statistics:  °  °Oxygen Saturation (%) Mean: 93%  ° °Minimum oxygen saturation (%):                 86%  ° °O2 Saturation Range (%): 86-99%   ° °O2 Saturation (minutes) <=88%: 0.2 min ° °Pulse Rate Statistics:  ° °Pulse Mean (bpm):    62/min   ° °Pulse Range (41-93/min)  ° °IMPRESSION: OSA (obstructive sleep apnea)  ° °RECOMMENDATION:  °This home sleep test demonstrates moderate obstructive sleep apnea with a total AHI of 22.8/hour and O2 nadir of 86%. Treatment with positive airway pressure is recommended. The patient will be advised to proceed with an autoPAP titration/trial at home for now. A full night titration study may be considered to optimize treatment settings, if needed down the road.  Alternative treatment options may include dental treatment with an oral appliance or surgical options.  Concomitant weight loss is recommended.  Please note that untreated obstructive sleep apnea may carry additional perioperative morbidity. Patients with significant obstructive sleep apnea should receive perioperative PAP therapy and the surgeons and particularly the anesthesiologist should be informed of the  diagnosis and the severity of the sleep disordered breathing. °The patient should be cautioned not to drive, work at heights, or operate dangerous or heavy equipment when tired or sleepy. Review and reiteration of good sleep hygiene measures should be pursued with any patient. °Other causes of the patient's symptoms, including circadian rhythm disturbances, an underlying mood disorder, medication effect and/or an underlying medical problem cannot be ruled out based on this test. Clinical correlation is recommended. The patient and her referring provider will be notified of the test results. The patient will be seen in follow up in sleep clinic at [facility]. ° °I certify that I have reviewed the raw data recording prior to the issuance of this report in accordance with the standards of the American Academy of Sleep Medicine (AASM). ° ° °INTERPRETING PHYSICIAN:  ° °Julieann Drummonds, MD, PhD  °Board Certified in Neurology and Sleep Medicine ° °[facility] °[address], [address] °[place], [place] [address] °[phone] ° ° ° ° ° ° ° ° ° ° ° ° ° ° ° ° °

## 2021-08-05 ENCOUNTER — Encounter: Payer: Self-pay | Admitting: Nurse Practitioner

## 2021-08-05 DIAGNOSIS — G4733 Obstructive sleep apnea (adult) (pediatric): Secondary | ICD-10-CM | POA: Insufficient documentation

## 2021-08-05 NOTE — Procedures (Signed)
° °  [facility]  HOME SLEEP TEST (Watch PAT) REPORT  STUDY DATE: 08/03/2021  DOB: Aug 18, 1975  MRN: [id number]  ORDERING CLINICIAN: Huston Foley, MD, PhD   REFERRING CLINICIAN: Marjie Skiff, NP   CLINICAL INFORMATION/HISTORY: 46 year old right-handed woman with an underlying medical history of diabetes, hypertension, depression, hyperlipidemia, and obesity, who reports snoring and excessive daytime somnolence.   Epworth sleepiness score: 18/24.  BMI: 36.2 kg/m  FINDINGS:   Sleep Summary:   Total Recording Time (hours, min): 9 hours, 36 minutes  Total Sleep Time (hours, min):  8 hours, 40 minutes   Percent REM (%):    23.4%   Respiratory Indices:   Calculated pAHI (per hour):  22.8/hour         REM pAHI:    33.7/hour       NREM pAHI: 19.4/hour  Oxygen Saturation Statistics:    Oxygen Saturation (%) Mean: 93%   Minimum oxygen saturation (%):                 86%   O2 Saturation Range (%): 86-99%    O2 Saturation (minutes) <=88%: 0.2 min  Pulse Rate Statistics:   Pulse Mean (bpm):    62/min    Pulse Range (41-93/min)   IMPRESSION: OSA (obstructive sleep apnea)   RECOMMENDATION:  This home sleep test demonstrates moderate obstructive sleep apnea with a total AHI of 22.8/hour and O2 nadir of 86%. Treatment with positive airway pressure is recommended. The patient will be advised to proceed with an autoPAP titration/trial at home for now. A full night titration study may be considered to optimize treatment settings, if needed down the road.  Alternative treatment options may include dental treatment with an oral appliance or surgical options.  Concomitant weight loss is recommended.  Please note that untreated obstructive sleep apnea may carry additional perioperative morbidity. Patients with significant obstructive sleep apnea should receive perioperative PAP therapy and the surgeons and particularly the anesthesiologist should be informed of the  diagnosis and the severity of the sleep disordered breathing. The patient should be cautioned not to drive, work at heights, or operate dangerous or heavy equipment when tired or sleepy. Review and reiteration of good sleep hygiene measures should be pursued with any patient. Other causes of the patient's symptoms, including circadian rhythm disturbances, an underlying mood disorder, medication effect and/or an underlying medical problem cannot be ruled out based on this test. Clinical correlation is recommended. The patient and her referring provider will be notified of the test results. The patient will be seen in follow up in sleep clinic at [facility].  I certify that I have reviewed the raw data recording prior to the issuance of this report in accordance with the standards of the American Academy of Sleep Medicine (AASM).   INTERPRETING PHYSICIAN:   Huston Foley, MD, PhD  Board Certified in Neurology and Sleep Medicine  [facility] [address] [place], [place] [address] [phone]

## 2021-08-05 NOTE — Addendum Note (Signed)
Addended by: Huston Foley on: 08/05/2021 05:36 PM   Modules accepted: Orders

## 2021-08-06 ENCOUNTER — Encounter: Payer: Self-pay | Admitting: Nurse Practitioner

## 2021-08-06 ENCOUNTER — Encounter: Payer: Self-pay | Admitting: Neurology

## 2021-08-06 ENCOUNTER — Other Ambulatory Visit: Payer: Self-pay

## 2021-08-06 ENCOUNTER — Ambulatory Visit (INDEPENDENT_AMBULATORY_CARE_PROVIDER_SITE_OTHER): Payer: BC Managed Care – PPO | Admitting: Nurse Practitioner

## 2021-08-06 DIAGNOSIS — E785 Hyperlipidemia, unspecified: Secondary | ICD-10-CM

## 2021-08-06 DIAGNOSIS — E559 Vitamin D deficiency, unspecified: Secondary | ICD-10-CM

## 2021-08-06 DIAGNOSIS — Z0000 Encounter for general adult medical examination without abnormal findings: Secondary | ICD-10-CM | POA: Diagnosis not present

## 2021-08-06 DIAGNOSIS — E1159 Type 2 diabetes mellitus with other circulatory complications: Secondary | ICD-10-CM | POA: Diagnosis not present

## 2021-08-06 DIAGNOSIS — Z9189 Other specified personal risk factors, not elsewhere classified: Secondary | ICD-10-CM

## 2021-08-06 DIAGNOSIS — E538 Deficiency of other specified B group vitamins: Secondary | ICD-10-CM

## 2021-08-06 DIAGNOSIS — G4733 Obstructive sleep apnea (adult) (pediatric): Secondary | ICD-10-CM

## 2021-08-06 DIAGNOSIS — Z114 Encounter for screening for human immunodeficiency virus [HIV]: Secondary | ICD-10-CM

## 2021-08-06 DIAGNOSIS — I152 Hypertension secondary to endocrine disorders: Secondary | ICD-10-CM | POA: Diagnosis not present

## 2021-08-06 DIAGNOSIS — Z1231 Encounter for screening mammogram for malignant neoplasm of breast: Secondary | ICD-10-CM

## 2021-08-06 DIAGNOSIS — F32A Depression, unspecified: Secondary | ICD-10-CM

## 2021-08-06 DIAGNOSIS — F419 Anxiety disorder, unspecified: Secondary | ICD-10-CM | POA: Diagnosis not present

## 2021-08-06 DIAGNOSIS — E1169 Type 2 diabetes mellitus with other specified complication: Secondary | ICD-10-CM | POA: Diagnosis not present

## 2021-08-06 DIAGNOSIS — Z1159 Encounter for screening for other viral diseases: Secondary | ICD-10-CM

## 2021-08-06 DIAGNOSIS — Z23 Encounter for immunization: Secondary | ICD-10-CM

## 2021-08-06 LAB — BAYER DCA HB A1C WAIVED: HB A1C (BAYER DCA - WAIVED): 6.2 % — ABNORMAL HIGH (ref 4.8–5.6)

## 2021-08-06 LAB — MICROALBUMIN, URINE WAIVED
Creatinine, Urine Waived: 100 mg/dL (ref 10–300)
Microalb, Ur Waived: 10 mg/L (ref 0–19)
Microalb/Creat Ratio: <30 mg/g (ref <30)

## 2021-08-06 MED ORDER — ONETOUCH ULTRASOFT LANCETS MISC: 12 refills | Status: DC

## 2021-08-06 MED ORDER — ONETOUCH VERIO VI STRP: ORAL_STRIP | 12 refills | Status: DC

## 2021-08-06 MED ORDER — ONETOUCH VERIO W/DEVICE KIT: PACK | 0 refills | Status: DC

## 2021-08-06 MED ORDER — FLUOXETINE HCL 40 MG PO CAPS: 40.0000 mg | ORAL_CAPSULE | Freq: Every day | ORAL | 4 refills | Status: DC

## 2021-08-06 NOTE — Assessment & Plan Note (Signed)
Chronic, ongoing.  Continue current medication regimen and adjust as needed. Lipid panel today. 

## 2021-08-06 NOTE — Assessment & Plan Note (Signed)
Chronic, ongoing with BP at goal today in office.  Recommend she monitor BP at least a few mornings a week at home and document.  DASH diet at home.  Continue current medication regimen and adjust as needed.  Labs today: CBC, TSH, CMP, urine ALB.  Return in 6 months.  Refills as needed.

## 2021-08-06 NOTE — Assessment & Plan Note (Signed)
Chronic, stable, denies SI/HI.  Has been on Prozac with benefit for years, will increase this to 40 MG as feels would have more benefit from this.  Sent in script.

## 2021-08-06 NOTE — Assessment & Plan Note (Addendum)
Chronic, ongoing with A1c 6.2% and urine ALB 10.  At this time will continue diet control, discussed with patient options available if elevation in future; such as GLP1, SGLT2, or Metformin.  She is working on diet changes and regular exercise with walks.  Recommend she check blood sugar at home a few days a week with goal in morning fasting <130 and goal 2 hours after eating <180.  Return in 6 months.  Supplies sent in.

## 2021-08-06 NOTE — Progress Notes (Signed)
BP 128/76    Pulse 71    Temp 98.7 F (37.1 C) (Oral)    Ht 5' 0.5" (1.537 m)    Wt 190 lb 3.2 oz (86.3 kg)    SpO2 98%    BMI 36.53 kg/m    Subjective:    Patient ID: Tiffany Odom, female    DOB: 08/07/1975, 46 y.o.   MRN: [id number]  HPI: Tiffany Odom is a 46 y.o. female presenting on 08/06/2021 for comprehensive medical examination. Current medical complaints include:none  She currently lives with: family Menopausal Symptoms: no  DIABETES Currently taking no medications, her last A1c was 6.4% in October 2022. Has never taken medications for this, at April 2022 visit is when diabetes was first diagnosed with previous PCP. Hypoglycemic episodes:no Polydipsia/polyuria: no Visual disturbance: no Chest pain: no Paresthesias: no Glucose Monitoring: no             Accucheck frequency: Not Checking             Fasting glucose:             Post prandial:             Evening:             Before meals: Taking Insulin?: no             Long acting insulin:             Short acting insulin: Blood Pressure Monitoring: rarely Retinal Examination: Not up to Date Foot Exam: Up to Date Pneumovax: will provide today Influenza: will provide today Aspirin: no    HYPERTENSION / HYPERLIPIDEMIA Currently taking Atorvastatin and Irbesartan daily -- started on Irbesartan back in April or May 2022 due to cough with ACE.  Had cough with Lisinopril after being on this for 10 years.  Rosuvastatin caused itchiness. Satisfied with current treatment? yes Duration of hypertension: chronic BP monitoring frequency: rarely BP range:  BP medication side effects: no Past BP meds: Lisinopril Duration of hyperlipidemia: chronic Cholesterol medication side effects: no Cholesterol supplements: none Past cholesterol medications: Rosuvastatin Medication compliance: good compliance Aspirin: no Recent stressors: no Recurrent headaches: no Visual changes: no Palpitations: no Dyspnea: no Chest pain:  no Lower extremity edema: no Dizzy/lightheaded: no   SLEEP APNEA Recently returned with + testing for moderate OSA.  Working with [facility] neurology on obtaining equipment. Sleep apnea status: stable Duration:  new diagnosis Wakes feeling refreshed:  no Daytime hypersomnolence:  yes Fatigue:  yes Insomnia:   varies Good sleep hygiene:  yes Difficulty falling asleep:  yes Difficulty staying asleep:  yes Snoring bothers bed partner:  no Observed apnea by bed partner: no Obesity:  yes Hypertension: yes  Pulmonary hypertension:  no Coronary artery disease:  no    DEPRESSION Takes Prozac 20 MG daily for depression daily, been on for >14 years.  She would like to try to go back to 40 MG, as feels that 20 MG is not helping as much. Mood status: stable Satisfied with current treatment?: yes Symptom severity: moderate  Duration of current treatment : chronic Side effects: no Medication compliance: good compliance Psychotherapy/counseling: yes in past Previous psychiatric medications: none Depressed mood: no Anxious mood: no Anhedonia: no Significant weight loss or gain: no Insomnia: none Fatigue: yes Feelings of worthlessness or guilt: no Impaired concentration/indecisiveness: no Suicidal ideations: no Hopelessness: no Crying spells: no Depression screen [facility] 2/9 08/06/2021 05/01/2021 04/07/2021  Decreased Interest 2 2 0  Down, Depressed, Hopeless 2 2  1  PHQ - 2 Score '4 4 1  ' Altered sleeping 3 1 0  Tired, decreased energy '3 3 3  ' Change in appetite 2 1 0  Feeling bad or failure about yourself  2 0 0  Trouble concentrating 2 0 1  Moving slowly or fidgety/restless 0 0 0  Suicidal thoughts 0 0 0  PHQ-9 Score '16 9 5  ' Difficult doing work/chores - Not difficult at all Somewhat difficult    The patient does not have a history of falls. I did not complete a risk assessment for falls. A plan of care for falls was not documented.   Past Medical History:  Past Medical History:   Diagnosis Date   Depression    Diabetes ([place])    Hyperlipemia    Hypertension     Surgical History:  Past Surgical History:  Procedure Laterality Date   ABDOMINAL HYSTERECTOMY     BILATERAL CARPAL TUNNEL RELEASE     CESAREAN SECTION     CHOLECYSTECTOMY      Medications:  Current Outpatient Medications on File Prior to Visit  Medication Sig   albuterol (VENTOLIN HFA) 108 (90 Base) MCG/ACT inhaler Inhale 2 puffs into the lungs every 6 (six) hours as needed for wheezing or shortness of breath.   atorvastatin (LIPITOR) 80 MG tablet Take by mouth.   irbesartan (AVAPRO) 150 MG tablet Take by mouth.   No current facility-administered medications on file prior to visit.    Allergies:  Allergies  Allergen Reactions   Oxycodone-Acetaminophen Rash    Overly sedated on Percocet at time of lithotripsy 2010   Sulfamethoxazole-Trimethoprim Rash    Drug rash January 2017   Lisinopril Cough   Rosuvastatin Other (See Comments)   Cyclobenzaprine Rash    "rasied Blood pressure"     Social History:  Social History   Socioeconomic History   Marital status: Divorced    Spouse name: Not on file   Number of children: Not on file   Years of education: Not on file   Highest education level: Not on file  Occupational History   Not on file  Tobacco Use   Smoking status: Never   Smokeless tobacco: Never  Vaping Use   Vaping Use: Never used  Substance and Sexual Activity   Alcohol use: Yes    Comment: OCC   Drug use: Not on file   Sexual activity: Not on file  Other Topics Concern   Not on file  Social History Narrative   Not on file   Social Determinants of Health   Financial Resource Strain: Low Risk    Difficulty of Paying Living Expenses: Not hard at all  Food Insecurity: No Food Insecurity   Worried About Charity fundraiser in the Last Year: Never true   [place] in the Last Year: Never true  Transportation Needs: No Transportation Needs   Lack of  Transportation (Medical): No   Lack of Transportation (Non-Medical): No  Physical Activity: Insufficiently Active   Days of Exercise per Week: 4 days   Minutes of Exercise per Session: 30 min  Stress: No Stress Concern Present   Feeling of Stress : Only a little  Social Connections: Socially Isolated   Frequency of Communication with Friends and Family: Three times a week   Frequency of Social Gatherings with Friends and Family: Three times a week   Attends Religious Services: Never   Active Member of Clubs or Organizations: Not on file  Attends Archivist Meetings: Never   Marital Status: Separated  Intimate Partner Violence: Not At Risk   Fear of Current or Ex-Partner: No   Emotionally Abused: No   Physically Abused: No   Sexually Abused: No   Social History   Tobacco Use  Smoking Status Never  Smokeless Tobacco Never   Social History   Substance and Sexual Activity  Alcohol Use Yes   Comment: OCC    Family History:  Family History  Problem Relation Age of Onset   Diabetes Mother    ADD / ADHD Mother    Heart Problems Mother    Sleep apnea Mother    Diabetes Father    Heart Problems Father    Healthy Sister    Healthy Sister    Healthy Sister    Healthy Sister    Healthy Sister    Healthy Brother    Heart Problems Maternal Grandmother    Diabetes Maternal Grandmother    Heart Problems Paternal Grandmother    Diabetes Paternal Grandmother     Past medical history, surgical history, medications, allergies, family history and social history reviewed with patient today and changes made to appropriate areas of the chart.   ROS All other ROS negative except what is listed above and in the HPI.      Objective:    BP 128/76    Pulse 71    Temp 98.7 F (37.1 C) (Oral)    Ht 5' 0.5" (1.537 m)    Wt 190 lb 3.2 oz (86.3 kg)    SpO2 98%    BMI 36.53 kg/m   Wt Readings from Last 3 Encounters:  08/06/21 190 lb 3.2 oz (86.3 kg)  07/01/21 191 lb 3.2 oz  (86.7 kg)  05/01/21 192 lb 9.6 oz (87.4 kg)    Physical Exam Vitals and nursing note reviewed. Exam conducted with a chaperone present.  Constitutional:      General: She is awake. She is not in acute distress.    Appearance: She is well-developed and well-groomed. She is obese. She is not ill-appearing or toxic-appearing.  HENT:     Head: Normocephalic and atraumatic.     Right Ear: Hearing, tympanic membrane, ear canal and external ear normal. No drainage.     Left Ear: Hearing, tympanic membrane, ear canal and external ear normal. No drainage.     Nose: Nose normal.     Right Sinus: No maxillary sinus tenderness or frontal sinus tenderness.     Left Sinus: No maxillary sinus tenderness or frontal sinus tenderness.     Mouth/Throat:     Mouth: Mucous membranes are moist.     Pharynx: Oropharynx is clear. Uvula midline. No pharyngeal swelling, oropharyngeal exudate or posterior oropharyngeal erythema.  Eyes:     General: Lids are normal.        Right eye: No discharge.        Left eye: No discharge.     Extraocular Movements: Extraocular movements intact.     Conjunctiva/sclera: Conjunctivae normal.     Pupils: Pupils are equal, round, and reactive to light.     Visual Fields: Right eye visual fields normal and left eye visual fields normal.  Neck:     Thyroid: No thyromegaly.     Vascular: No carotid bruit.     Trachea: Trachea normal.  Cardiovascular:     Rate and Rhythm: Normal rate and regular rhythm.     Heart sounds: Normal heart sounds. No  murmur heard.   No gallop.  Pulmonary:     Effort: Pulmonary effort is normal. No accessory muscle usage or respiratory distress.     Breath sounds: Normal breath sounds.  Abdominal:     General: Bowel sounds are normal.     Palpations: Abdomen is soft. There is no hepatomegaly or splenomegaly.     Tenderness: There is no abdominal tenderness.  Musculoskeletal:        General: Normal range of motion.     Cervical back: Normal  range of motion and neck supple.     Right lower leg: No edema.     Left lower leg: No edema.  Lymphadenopathy:     Head:     Right side of head: No submental, submandibular, tonsillar, preauricular or posterior auricular adenopathy.     Left side of head: No submental, submandibular, tonsillar, preauricular or posterior auricular adenopathy.     Cervical: No cervical adenopathy.  Skin:    General: Skin is warm and dry.     Capillary Refill: Capillary refill takes less than 2 seconds.     Findings: No rash.  Neurological:     Mental Status: She is alert and oriented to person, place, and time.     Gait: Gait is intact.     Deep Tendon Reflexes: Reflexes are normal and symmetric.     Reflex Scores:      Brachioradialis reflexes are 2+ on the right side and 2+ on the left side.      Patellar reflexes are 2+ on the right side and 2+ on the left side. Psychiatric:        Attention and Perception: Attention normal.        Mood and Affect: Mood normal.        Speech: Speech normal.        Behavior: Behavior normal. Behavior is cooperative.        Thought Content: Thought content normal.        Judgment: Judgment normal.   Results for orders placed or performed in visit on 04/07/21  Novel Coronavirus, NAA ([facility])   Specimen: Nasopharyngeal(NP) swabs in vial transport medium  Result Value Ref Range   SARS-CoV-2, NAA Not Detected Not Detected  SARS-COV-2, NAA 2 DAY TAT  Result Value Ref Range   SARS-CoV-2, NAA 2 DAY TAT Performed   Bayer DCA Hb A1c Waived  Result Value Ref Range   HB A1C (BAYER DCA - WAIVED) 6.4 (H) 4.8 - 5.6 %  Comprehensive metabolic panel  Result Value Ref Range   Glucose 104 (H) 70 - 99 mg/dL   BUN 17 6 - 24 mg/dL   Creatinine, Ser 0.91 0.57 - 1.00 mg/dL   eGFR 80 >59 mL/min/1.73   BUN/Creatinine Ratio 19 9 - 23   Sodium 136 134 - 144 mmol/L   Potassium 4.0 3.5 - 5.2 mmol/L   Chloride 98 96 - 106 mmol/L   CO2 24 20 - 29 mmol/L   Calcium 9.6 8.7 - 10.2  mg/dL   Total Protein 7.0 6.0 - 8.5 g/dL   Albumin 4.4 3.8 - 4.8 g/dL   Globulin, Total 2.6 1.5 - 4.5 g/dL   Albumin/Globulin Ratio 1.7 1.2 - 2.2   Bilirubin Total 0.6 0.0 - 1.2 mg/dL   Alkaline Phosphatase 103 44 - 121 IU/L   AST 20 0 - 40 IU/L   ALT 24 0 - 32 IU/L  Lipid Panel w/o Chol/HDL Ratio  Result Value Ref Range  Cholesterol, Total 226 (H) 100 - 199 mg/dL   Triglycerides 443 (H) 0 - 149 mg/dL   HDL 27 (L) >39 mg/dL   VLDL Cholesterol Cal 79 (H) 5 - 40 mg/dL   LDL Chol Calc (NIH) 120 (H) 0 - 99 mg/dL  TSH  Result Value Ref Range   TSH 1.340 0.450 - 4.500 uIU/mL  CBC with Differential/Platelet  Result Value Ref Range   WBC 9.5 3.4 - 10.8 x10E3/uL   RBC 4.50 3.77 - 5.28 x10E6/uL   Hemoglobin 13.8 11.1 - 15.9 g/dL   Hematocrit 40.2 34.0 - 46.6 %   MCV 89 79 - 97 fL   MCH 30.7 26.6 - 33.0 pg   MCHC 34.3 31.5 - 35.7 g/dL   RDW 11.8 11.7 - 15.4 %   Platelets 362 150 - 450 x10E3/uL   Neutrophils 62 Not Estab. %   Lymphs 29 Not Estab. %   Monocytes 6 Not Estab. %   Eos 2 Not Estab. %   Basos 1 Not Estab. %   Neutrophils Absolute 5.9 1.4 - 7.0 x10E3/uL   Lymphocytes Absolute 2.7 0.7 - 3.1 x10E3/uL   Monocytes Absolute 0.6 0.1 - 0.9 x10E3/uL   EOS (ABSOLUTE) 0.2 0.0 - 0.4 x10E3/uL   Basophils Absolute 0.1 0.0 - 0.2 x10E3/uL   Immature Granulocytes 0 Not Estab. %   Immature Grans (Abs) 0.0 0.0 - 0.1 x10E3/uL  Microalbumin, Urine Waived  Result Value Ref Range   Microalb, Ur Waived 10 0 - 19 mg/L   Creatinine, Urine Waived 50 10 - 300 mg/dL   Microalb/Creat Ratio <30 <30 mg/g  Veritor Flu A/B Waived  Result Value Ref Range   Influenza A Negative Negative   Influenza B Negative Negative      Assessment & Plan:   Problem List Items Addressed This Visit       Cardiovascular and Mediastinum   Hypertension associated with diabetes ([place])    Chronic, ongoing with BP at goal today in office.  Recommend she monitor BP at least a few mornings a week at home and  document.  DASH diet at home.  Continue current medication regimen and adjust as needed.  Labs today: CBC, TSH, CMP, urine ALB.  Return in 6 months.  Refills as needed.      Relevant Orders   Bayer DCA Hb A1c Waived   Microalbumin, Urine Waived   CBC with Differential/Platelet   Comprehensive metabolic panel   TSH     Respiratory   OSA (obstructive sleep apnea)    New diagnosis on 08/05/21 -- CPAP equipment being ordered.  Suspect this will help with her fatigue.        Endocrine   Hyperlipidemia associated with type 2 diabetes mellitus (HCC)    Chronic, ongoing.  Continue current medication regimen and adjust as needed.  Lipid panel today.         Relevant Orders   Bayer DCA Hb A1c Waived   Comprehensive metabolic panel   Lipid Panel w/o Chol/HDL Ratio   Type 2 diabetes mellitus with morbid obesity (HCC) - Primary    Chronic, ongoing with A1c 6.2% and urine ALB 10.  At this time will continue diet control, discussed with patient options available if elevation in future; such as GLP1, SGLT2, or Metformin.  She is working on diet changes and regular exercise with walks.  Recommend she check blood sugar at home a few days a week with goal in morning fasting <130 and goal 2  hours after eating <180.  Return in 6 months.  Supplies sent in.      Relevant Orders   Bayer DCA Hb A1c Waived   Microalbumin, Urine Waived     Other   Anxiety and depression    Chronic, stable, denies SI/HI.  Has been on Prozac with benefit for years, will increase this to 40 MG as feels would have more benefit from this.  Sent in script.      Relevant Medications   FLUoxetine (PROZAC) 40 MG capsule   Morbid obesity (HCC)    BMI 36.53 with T2DM, HTN/HLD.  Recommended eating smaller high protein, low fat meals more frequently and exercising 30 mins a day 5 times a week with a goal of 10-15lb weight loss in the next 3 months. Patient voiced their understanding and motivation to adhere to these  recommendations.       Other Visit Diagnoses     Vitamin D deficiency       History of low levels, recheck today and initiate supplement as needed.   Relevant Orders   VITAMIN D 25 Hydroxy (Vit-D Deficiency, Fractures)   Vitamin B12 deficiency       History of low levels, recheck today and initiate supplement as needed.   Relevant Orders   Vitamin B12   Need for hepatitis C screening test       Hep C screening on labs today, discussed with patient.   Relevant Orders   Hepatitis C antibody   Pneumococcal vaccination indicated       PPSV23 today   Relevant Orders   Pneumococcal polysaccharide vaccine 23-valent greater than or equal to 2yo subcutaneous/IM (Completed)   Flu vaccine need       Flu vaccine today.   Relevant Orders   Flu Vaccine QUAD 6+ mos PF IM (Fluarix Quad PF) (Completed)   Encounter for screening mammogram for malignant neoplasm of breast       Mammogram ordered today.   Relevant Orders   MM 3D SCREEN BREAST BILATERAL   Encounter for screening for HIV       HIV screening on labs today, discussed with patient.   Relevant Orders   HIV Antibody (routine testing w rflx)   Encounter for annual physical exam       Annual physical with labs today and health maintenance reviewed.        Follow up plan: Return in about 6 months (around 02/03/2022) for T2DM, HTN/HLD, MOOD.   LABORATORY TESTING:  - Pap smear: had hysterectomy  IMMUNIZATIONS:   - Tdap: Tetanus vaccination status reviewed: last tetanus booster within 10 years. - Influenza: Administered today - Pneumovax: Administered today - Prevnar: Not applicable - COVID: Up to date - HPV: Not applicable - Shingrix vaccine: Not applicable  SCREENING: -Mammogram: Ordered today  - Colonoscopy:  thinking about this   - Bone Density: Not applicable  -Hearing Test: Not applicable  -Spirometry: Not applicable   PATIENT COUNSELING:   Advised to take 1 mg of folate supplement per day if capable of pregnancy.    Sexuality: Discussed sexually transmitted diseases, partner selection, use of condoms, avoidance of unintended pregnancy  and contraceptive alternatives.   Advised to avoid cigarette smoking.  I discussed with the patient that most people either abstain from alcohol or drink within safe limits (<=14/week and <=4 drinks/occasion for males, <=7/weeks and <= 3 drinks/occasion for females) and that the risk for alcohol disorders and other health effects rises proportionally with the number  of drinks per week and how often a drinker exceeds daily limits.  Discussed cessation/primary prevention of drug use and availability of treatment for abuse.   Diet: Encouraged to adjust caloric intake to maintain  or achieve ideal body weight, to reduce intake of dietary saturated fat and total fat, to limit sodium intake by avoiding high sodium foods and not adding table salt, and to maintain adequate dietary potassium and calcium preferably from fresh fruits, vegetables, and low-fat dairy products.    stressed the importance of regular exercise  Injury prevention: Discussed safety belts, safety helmets, smoke detector, smoking near bedding or upholstery.   Dental health: Discussed importance of regular tooth brushing, flossing, and dental visits.    NEXT PREVENTATIVE PHYSICAL DUE IN 1 YEAR. Return in about 6 months (around 02/03/2022) for T2DM, HTN/HLD, MOOD.

## 2021-08-06 NOTE — Assessment & Plan Note (Signed)
BMI 36.53 with T2DM, HTN/HLD.  Recommended eating smaller high protein, low fat meals more frequently and exercising 30 mins a day 5 times a week with a goal of 10-15lb weight loss in the next 3 months. Patient voiced their understanding and motivation to adhere to these recommendations.

## 2021-08-06 NOTE — Telephone Encounter (Signed)
Faxed orders, office note, sleep study, and insurance info to [employer] @ [phone]. Received a receipt of confirmation.

## 2021-08-06 NOTE — Assessment & Plan Note (Signed)
New diagnosis on 08/05/21 -- CPAP equipment being ordered.  Suspect this will help with her fatigue.

## 2021-08-07 ENCOUNTER — Encounter: Payer: Self-pay | Admitting: Nurse Practitioner

## 2021-08-07 DIAGNOSIS — E559 Vitamin D deficiency, unspecified: Secondary | ICD-10-CM | POA: Insufficient documentation

## 2021-08-07 LAB — LIPID PANEL W/O CHOL/HDL RATIO

## 2021-08-08 LAB — CBC WITH DIFFERENTIAL/PLATELET
Basophils Absolute: 0 10*3/uL (ref 0.0–0.2)
Basos: 1 %
EOS (ABSOLUTE): 0.2 10*3/uL (ref 0.0–0.4)
Eos: 2 %
Hematocrit: 41.9 % (ref 34.0–46.6)
Hemoglobin: 13.8 g/dL (ref 11.1–15.9)
Immature Grans (Abs): 0 10*3/uL (ref 0.0–0.1)
Immature Granulocytes: 0 %
Lymphocytes Absolute: 2.2 10*3/uL (ref 0.7–3.1)
Lymphs: 31 %
MCH: 30.5 pg (ref 26.6–33.0)
MCHC: 32.9 g/dL (ref 31.5–35.7)
MCV: 93 fL (ref 79–97)
Monocytes Absolute: 0.5 10*3/uL (ref 0.1–0.9)
Monocytes: 7 %
Neutrophils Absolute: 4.4 10*3/uL (ref 1.4–7.0)
Neutrophils: 59 %
Platelets: 339 10*3/uL (ref 150–450)
RBC: 4.52 x10E6/uL (ref 3.77–5.28)
RDW: 11.8 % (ref 11.7–15.4)
WBC: 7.3 10*3/uL (ref 3.4–10.8)

## 2021-08-08 LAB — COMPREHENSIVE METABOLIC PANEL
ALT: 24 IU/L (ref 0–32)
AST: 21 IU/L (ref 0–40)
Albumin/Globulin Ratio: 1.5 (ref 1.2–2.2)
Albumin: 3.9 g/dL (ref 3.8–4.8)
Alkaline Phosphatase: 82 IU/L (ref 44–121)
BUN/Creatinine Ratio: 15 (ref 9–23)
BUN: 11 mg/dL (ref 6–24)
Bilirubin Total: 0.6 mg/dL (ref 0.0–1.2)
CO2: 22 mmol/L (ref 20–29)
Calcium: 9 mg/dL (ref 8.7–10.2)
Chloride: 103 mmol/L (ref 96–106)
Creatinine, Ser: 0.74 mg/dL (ref 0.57–1.00)
Globulin, Total: 2.6 g/dL (ref 1.5–4.5)
Glucose: 101 mg/dL — ABNORMAL HIGH (ref 70–99)
Potassium: 4.4 mmol/L (ref 3.5–5.2)
Sodium: 140 mmol/L (ref 134–144)
Total Protein: 6.5 g/dL (ref 6.0–8.5)
eGFR: 102 mL/min/{1.73_m2} (ref >59)

## 2021-08-08 LAB — HIV ANTIBODY (ROUTINE TESTING W REFLEX): HIV Screen 4th Generation wRfx: NONREACTIVE

## 2021-08-08 LAB — LIPID PANEL W/O CHOL/HDL RATIO
Cholesterol, Total: 192 mg/dL (ref 100–199)
HDL: 31 mg/dL — ABNORMAL LOW (ref >39)
LDL Chol Calc (NIH): 120 mg/dL — ABNORMAL HIGH (ref 0–99)
Triglycerides: 230 mg/dL — ABNORMAL HIGH (ref 0–149)
VLDL Cholesterol Cal: 41 mg/dL — ABNORMAL HIGH (ref 5–40)

## 2021-08-08 LAB — VITAMIN B12: Vitamin B-12: 433 pg/mL (ref 232–1245)

## 2021-08-08 LAB — VITAMIN D 25 HYDROXY (VIT D DEFICIENCY, FRACTURES): Vit D, 25-Hydroxy: 19.6 ng/mL — ABNORMAL LOW (ref 30.0–100.0)

## 2021-08-08 LAB — TSH: TSH: 1.76 u[IU]/mL (ref 0.450–4.500)

## 2021-08-08 LAB — HEPATITIS C ANTIBODY: Hep C Virus Ab: <0.1 s/co ratio (ref 0.0–0.9)

## 2021-08-08 NOTE — Progress Notes (Signed)
Contacted via MyChart   Good morning Tiffany Odom, your labs have returned. Everything is nice and stable with exception of cholesterol labs remaining above goal and Vitamin D remaining low.  Are you taking Atorvastatin every day?  Were you fasting?  If so let me know and if you answer yes to both questions I would like to add on Zetia 10 MG daily to take with Atorvastatin to help further reduce your cholesterol levels.  Also please ensure you are taking Vitamin D3 2000 units daily for overall bone and muscle health.  Any questions?   Keep being amazing!!  Thank you for allowing me to participate in your care.  I appreciate you. Kindest regards, Sadee Osland

## 2021-08-11 ENCOUNTER — Encounter: Payer: Self-pay | Admitting: Nurse Practitioner

## 2021-08-11 MED ORDER — EZETIMIBE 10 MG PO TABS: 10.0000 mg | ORAL_TABLET | Freq: Every day | ORAL | 3 refills | Status: DC

## 2021-08-17 NOTE — Telephone Encounter (Signed)
Denyse Amass, RN; Vanessa Ralphs got it      Previous Messages   ----- Message -----  From: Brandon Melnick, RN  Sent: 08/12/2021   9:27 AM EST  To: Vanessa Ralphs, Marchelle Gearing, *  Subject: new cpap user                                   Good morning.  I have pt Tiffany Odom  Female, 46 y.o., 03/25/1976  MRN:  [id number]   Had HST 08-03-2021 diagnosed with moderate OSA prescribed an autopap.  Her mother had an machine that was never used and  Marguarite would like to use this machine. (resmed).  She lives in [place], [place] and wanted a closer location,  Tried [facility] and was told they could not do that.  I wonder why?  Are you able to explain to me?  Would you all be able to assist?     Acupuncturist

## 2021-08-26 ENCOUNTER — Encounter: Payer: Self-pay | Admitting: Neurology

## 2021-08-26 DIAGNOSIS — G4719 Other hypersomnia: Secondary | ICD-10-CM

## 2021-08-26 DIAGNOSIS — G4733 Obstructive sleep apnea (adult) (pediatric): Secondary | ICD-10-CM

## 2021-08-26 DIAGNOSIS — R0683 Snoring: Secondary | ICD-10-CM

## 2021-08-26 DIAGNOSIS — E669 Obesity, unspecified: Secondary | ICD-10-CM

## 2021-08-27 NOTE — Telephone Encounter (Signed)
Ross Ludwig, RN; Dimas Millin ?got it   ? ?  ?Previous Messages ?  ?----- Message -----  ?From: Guy Begin, RN  ?Sent: 08/26/2021   3:27 PM EST  ?To: Wilford Sports  ?Subject: transfer of care for pt (use machine she has)  ? ?New order in Epic for pt.  Thank you.  ? ?Dionne Milo  ?Female, 46 y.o., 1976/06/25  ?Pronouns:  ?she/her/hers  ?MRN:  ?[id number]  ? ?Sandy RN  ? ?

## 2021-09-08 DIAGNOSIS — G4733 Obstructive sleep apnea (adult) (pediatric): Secondary | ICD-10-CM | POA: Diagnosis not present

## 2021-09-30 ENCOUNTER — Encounter: Payer: Self-pay | Admitting: Nurse Practitioner

## 2021-09-30 ENCOUNTER — Other Ambulatory Visit: Payer: Self-pay | Admitting: Nurse Practitioner

## 2021-09-30 MED ORDER — EZETIMIBE 10 MG PO TABS: 10.0000 mg | ORAL_TABLET | Freq: Every day | ORAL | 4 refills | Status: DC

## 2021-09-30 MED ORDER — ATORVASTATIN CALCIUM 80 MG PO TABS
80.0000 mg | ORAL_TABLET | Freq: Every day | ORAL | 4 refills | Status: DC
Start: 2021-09-30 — End: 2022-08-25

## 2021-09-30 MED ORDER — ONETOUCH VERIO VI STRP: ORAL_STRIP | 12 refills | Status: DC

## 2021-09-30 MED ORDER — ONETOUCH VERIO W/DEVICE KIT: PACK | 0 refills | Status: DC

## 2021-09-30 MED ORDER — ONETOUCH VERIO W/DEVICE KIT
PACK | 0 refills | Status: DC
Start: 2021-09-30 — End: 2023-07-15

## 2021-09-30 MED ORDER — EZETIMIBE 10 MG PO TABS
10.0000 mg | ORAL_TABLET | Freq: Every day | ORAL | 4 refills | Status: DC
Start: 2021-09-30 — End: 2022-02-18

## 2021-09-30 MED ORDER — ONETOUCH VERIO VI STRP
ORAL_STRIP | 12 refills | Status: DC
Start: 2021-09-30 — End: 2022-03-24

## 2021-09-30 MED ORDER — ATORVASTATIN CALCIUM 80 MG PO TABS: 80.0000 mg | ORAL_TABLET | Freq: Every day | ORAL | 4 refills | Status: DC

## 2021-10-22 ENCOUNTER — Encounter: Payer: Self-pay | Admitting: Nurse Practitioner

## 2021-10-22 NOTE — Telephone Encounter (Signed)
Patient scheduled tomorrow with Dr. Laural Benes ?

## 2021-10-23 ENCOUNTER — Ambulatory Visit (INDEPENDENT_AMBULATORY_CARE_PROVIDER_SITE_OTHER): Payer: BC Managed Care – PPO | Admitting: Family Medicine

## 2021-10-23 ENCOUNTER — Ambulatory Visit
Admission: RE | Admit: 2021-10-23 | Discharge: 2021-10-23 | Disposition: A | Discharge status: Home / Self Care | Payer: BC Managed Care – PPO | Source: Ambulatory Visit | Attending: Family Medicine | Admitting: Family Medicine

## 2021-10-23 ENCOUNTER — Encounter: Payer: Self-pay | Admitting: Family Medicine

## 2021-10-23 VITALS — Temp 98.3°F | Wt 189.4 lb

## 2021-10-23 DIAGNOSIS — R55 Syncope and collapse: Secondary | ICD-10-CM

## 2021-10-23 DIAGNOSIS — E1169 Type 2 diabetes mellitus with other specified complication: Secondary | ICD-10-CM | POA: Diagnosis not present

## 2021-10-23 DIAGNOSIS — H519 Unspecified disorder of binocular movement: Secondary | ICD-10-CM

## 2021-10-23 LAB — BAYER DCA HB A1C WAIVED: HB A1C (BAYER DCA - WAIVED): 5.7 % — ABNORMAL HIGH (ref 4.8–5.6)

## 2021-10-23 NOTE — Progress Notes (Signed)
? ?Temp 98.3 ?F (36.8 ?C)   Wt 189 lb 6.4 oz (85.9 kg)   SpO2 97%   BMI 36.38 kg/m?   ? ?Subjective:  ? ? Patient ID: Tiffany Odom, female    DOB: [birth year], 46 y.o.   MRN: [id number] ? ?HPI: ?Tiffany Odom is a 46 y.o. female ? ?Chief Complaint  ?Patient presents with  ? Fall  ?  Patient states she was sitting in the chair yesterday and bent over and thinks she fainted. Patient is not sure how long she would've fainted for. Patient felt dizzy and light headed yesterday.   ? ?Was dizzy in the AM yesterday, passed out when she was sitting getting her shoes on. No prodrome to passing out. +blurred vision. Sugars were in the 130s when she passed out. She is unsure how long she was out for. It was unwitnessed.  She does not think she hit her head and currently has no injuries associated with it. No chest pain. + SOB. Has never passed out before. She does have a mild headache today. No more blurred vision. She is still not feeling right. No other concerns or complaints at this time.  ? ?Relevant past medical, surgical, family and social history reviewed and updated as indicated. Interim medical history since our last visit reviewed. ?Allergies and medications reviewed and updated. ? ?Review of Systems  ?Constitutional: Negative.   ?HENT: Negative.    ?Eyes:   ?     +blurred vision  ?Respiratory:  Positive for shortness of breath. Negative for apnea, cough, choking, chest tightness, wheezing and stridor.   ?Cardiovascular: Negative.   ?Gastrointestinal: Negative.   ?Musculoskeletal: Negative.   ?Neurological:  Positive for dizziness and headaches. Negative for tremors, seizures, syncope, facial asymmetry, speech difficulty, weakness, light-headedness and numbness.  ?Hematological: Negative.   ?Psychiatric/Behavioral: Negative.    ? ?Per HPI unless specifically indicated above ? ?   ?Objective:  ? Standing: 149/85 Sitting: 126/76, supine: 132/87 ?Temp 98.3 ?F (36.8 ?C)   Wt 189 lb 6.4 oz (85.9 kg)   SpO2 97%   BMI  36.38 kg/m?   ?Wt Readings from Last 3 Encounters:  ?10/23/21 189 lb 6.4 oz (85.9 kg)  ?08/06/21 190 lb 3.2 oz (86.3 kg)  ?07/01/21 191 lb 3.2 oz (86.7 kg)  ?  ?Physical Exam ?Vitals and nursing note reviewed.  ?Constitutional:   ?   General: She is not in acute distress. ?   Appearance: Normal appearance. She is not ill-appearing, toxic-appearing or diaphoretic.  ?HENT:  ?   Head: Normocephalic and atraumatic.  ?   Right Ear: External ear normal.  ?   Left Ear: External ear normal.  ?   Nose: Nose normal.  ?   Mouth/Throat:  ?   Mouth: Mucous membranes are moist.  ?   Pharynx: Oropharynx is clear.  ?Eyes:  ?   General: No scleral icterus.    ?   Right eye: No discharge.     ?   Left eye: No discharge.  ?   Extraocular Movements:  ?   Right eye: Abnormal extraocular motion present.  ?   Left eye: Abnormal extraocular motion present.  ?   Conjunctiva/sclera: Conjunctivae normal.  ?   Pupils: Pupils are equal, round, and reactive to light.  ?   Comments: With lateral movement on both eyes, eyes looked laterally then dropped back to bottom midline and then looked laterally again involuntarily  ?Cardiovascular:  ?   Rate and Rhythm: Normal rate  and regular rhythm.  ?   Pulses: Normal pulses.  ?   Heart sounds: Normal heart sounds. No murmur heard. ?  No friction rub. No gallop.  ?Pulmonary:  ?   Effort: Pulmonary effort is normal. No respiratory distress.  ?   Breath sounds: Normal breath sounds. No stridor. No wheezing, rhonchi or rales.  ?Chest:  ?   Chest wall: No tenderness.  ?Musculoskeletal:     ?   General: Normal range of motion.  ?   Cervical back: Normal range of motion and neck supple.  ?Skin: ?   General: Skin is warm and dry.  ?   Capillary Refill: Capillary refill takes less than 2 seconds.  ?   Coloration: Skin is not jaundiced or pale.  ?   Findings: No bruising, erythema, lesion or rash.  ?Neurological:  ?   General: No focal deficit present.  ?   Mental Status: She is alert and oriented to person,  place, and time. Mental status is at baseline.  ?Psychiatric:     ?   Mood and Affect: Mood normal.     ?   Behavior: Behavior normal.     ?   Thought Content: Thought content normal.     ?   Judgment: Judgment normal.  ? ? ?Results for orders placed or performed in visit on 08/06/21  ?Bayer DCA Hb A1c Waived  ?Result Value Ref Range  ? HB A1C (BAYER DCA - WAIVED) 6.2 (H) 4.8 - 5.6 %  ?Microalbumin, Urine Waived  ?Result Value Ref Range  ? Microalb, Ur Waived 10 0 - 19 mg/L  ? Creatinine, Urine Waived 100 10 - 300 mg/dL  ? Microalb/Creat Ratio <30 <30 mg/g  ?CBC with Differential/Platelet  ?Result Value Ref Range  ? WBC 7.3 3.4 - 10.8 x10E3/uL  ? RBC 4.52 3.77 - 5.28 x10E6/uL  ? Hemoglobin 13.8 11.1 - 15.9 g/dL  ? Hematocrit 41.9 34.0 - 46.6 %  ? MCV 93 79 - 97 fL  ? MCH 30.5 26.6 - 33.0 pg  ? MCHC 32.9 31.5 - 35.7 g/dL  ? RDW 11.8 11.7 - 15.4 %  ? Platelets 339 150 - 450 x10E3/uL  ? Neutrophils 59 Not Estab. %  ? Lymphs 31 Not Estab. %  ? Monocytes 7 Not Estab. %  ? Eos 2 Not Estab. %  ? Basos 1 Not Estab. %  ? Neutrophils Absolute 4.4 1.4 - 7.0 x10E3/uL  ? Lymphocytes Absolute 2.2 0.7 - 3.1 x10E3/uL  ? Monocytes Absolute 0.5 0.1 - 0.9 x10E3/uL  ? EOS (ABSOLUTE) 0.2 0.0 - 0.4 x10E3/uL  ? Basophils Absolute 0.0 0.0 - 0.2 x10E3/uL  ? Immature Granulocytes 0 Not Estab. %  ? Immature Grans (Abs) 0.0 0.0 - 0.1 x10E3/uL  ?Comprehensive metabolic panel  ?Result Value Ref Range  ? Glucose 101 (H) 70 - 99 mg/dL  ? BUN 11 6 - 24 mg/dL  ? Creatinine, Ser 0.74 0.57 - 1.00 mg/dL  ? eGFR 102 >59 mL/min/1.73  ? BUN/Creatinine Ratio 15 9 - 23  ? Sodium 140 134 - 144 mmol/L  ? Potassium 4.4 3.5 - 5.2 mmol/L  ? Chloride 103 96 - 106 mmol/L  ? CO2 22 20 - 29 mmol/L  ? Calcium 9.0 8.7 - 10.2 mg/dL  ? Total Protein 6.5 6.0 - 8.5 g/dL  ? Albumin 3.9 3.8 - 4.8 g/dL  ? Globulin, Total 2.6 1.5 - 4.5 g/dL  ? Albumin/Globulin Ratio 1.5 1.2 - 2.2  ? Bilirubin  Total 0.6 0.0 - 1.2 mg/dL  ? Alkaline Phosphatase 82 44 - 121 IU/L  ? AST 21 0 - 40  IU/L  ? ALT 24 0 - 32 IU/L  ?Lipid Panel w/o Chol/HDL Ratio  ?Result Value Ref Range  ? Cholesterol, Total 192 100 - 199 mg/dL  ? Triglycerides 230 (H) 0 - 149 mg/dL  ? HDL 31 (L) >39 mg/dL  ? VLDL Cholesterol Cal 41 (H) 5 - 40 mg/dL  ? LDL Chol Calc (NIH) 120 (H) 0 - 99 mg/dL  ?TSH  ?Result Value Ref Range  ? TSH 1.760 0.450 - 4.500 uIU/mL  ?VITAMIN D 25 Hydroxy (Vit-D Deficiency, Fractures)  ?Result Value Ref Range  ? Vit D, 25-Hydroxy 19.6 (L) 30.0 - 100.0 ng/mL  ?Vitamin B12  ?Result Value Ref Range  ? Vitamin B-12 433 232 - 1,245 pg/mL  ?Hepatitis C antibody  ?Result Value Ref Range  ? Hep C Virus Ab <0.1 0.0 - 0.9 s/co ratio  ?HIV Antibody (routine testing w rflx)  ?Result Value Ref Range  ? HIV Screen 4th Generation wRfx Non Reactive Non Reactive  ? ?   ?Assessment & Plan:  ? ?Problem List Items Addressed This Visit   ? ?  ? Endocrine  ? 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  Sugars doing well with A1c of 5.7. Sugar at time of syncope was in the 130s, doubt hypoglycemia.  ? ?  ?  ?  ? Other  ? Syncope - Primary  ?  No prodrome besides dizziness. Syncope occurred while seated. Not orthostatic. Given history of HTN, DM, and HLD and abnormal eye movements, will get her in for CT head to R/O stroke. Will await other labs and get her into cardiology. Likely needs holter. EKG normal today. Await their input. Call with any concerns.  ? ?  ?  ? Relevant Orders  ? EKG 12-Lead (Completed)  ? Comprehensive metabolic panel  ? CBC with Differential/Platelet  ? Bayer DCA Hb A1c Waived  ? Lipid Panel w/o Chol/HDL Ratio  ? TSH  ? CT HEAD WO CONTRAST (5MM)  ? Ambulatory referral to Cardiology  ? ?Other Visit Diagnoses   ? ? Abnormal eye movements      ? Getting CT head today. Await results.   ? Relevant Orders  ? CT HEAD WO CONTRAST (5MM)  ? ?  ?  ? ?Follow up plan: ?Return in about 2 weeks (around 11/06/2021) for with PCP. ? ?>30 minutes spent with patient today. ? ? ? ?

## 2021-10-23 NOTE — Assessment & Plan Note (Signed)
Sugars doing well with A1c of 5.7. Sugar at time of syncope was in the 130s, doubt hypoglycemia.  ?

## 2021-10-23 NOTE — Progress Notes (Signed)
Interpreted by me today with NSR at 67bpm. No ST segment changes.

## 2021-10-23 NOTE — Assessment & Plan Note (Addendum)
No prodrome besides dizziness. Syncope occurred while seated. Not orthostatic. Given history of HTN, DM, and HLD and abnormal eye movements, will get her in for CT head to R/O stroke vs mass effect. Will await other labs and get her into cardiology. Likely needs holter. EKG normal today. Await their input. Call with any concerns.  ?

## 2021-10-24 LAB — COMPREHENSIVE METABOLIC PANEL
ALT: 25 IU/L (ref 0–32)
AST: 16 IU/L (ref 0–40)
Albumin/Globulin Ratio: 1.8 (ref 1.2–2.2)
Albumin: 4.1 g/dL (ref 3.8–4.8)
Alkaline Phosphatase: 77 IU/L (ref 44–121)
BUN/Creatinine Ratio: 23 (ref 9–23)
BUN: 18 mg/dL (ref 6–24)
Bilirubin Total: 0.4 mg/dL (ref 0.0–1.2)
CO2: 20 mmol/L (ref 20–29)
Calcium: 9.6 mg/dL (ref 8.7–10.2)
Chloride: 103 mmol/L (ref 96–106)
Creatinine, Ser: 0.77 mg/dL (ref 0.57–1.00)
Globulin, Total: 2.3 g/dL (ref 1.5–4.5)
Glucose: 120 mg/dL — ABNORMAL HIGH (ref 70–99)
Potassium: 4.8 mmol/L (ref 3.5–5.2)
Sodium: 140 mmol/L (ref 134–144)
Total Protein: 6.4 g/dL (ref 6.0–8.5)
eGFR: 97 mL/min/{1.73_m2} (ref >59)

## 2021-10-24 LAB — CBC WITH DIFFERENTIAL/PLATELET
Basophils Absolute: 0.1 10*3/uL (ref 0.0–0.2)
Basos: 1 %
EOS (ABSOLUTE): 0.1 10*3/uL (ref 0.0–0.4)
Eos: 1 %
Hematocrit: 41.5 % (ref 34.0–46.6)
Hemoglobin: 14 g/dL (ref 11.1–15.9)
Immature Grans (Abs): 0 10*3/uL (ref 0.0–0.1)
Immature Granulocytes: 0 %
Lymphocytes Absolute: 2.9 10*3/uL (ref 0.7–3.1)
Lymphs: 29 %
MCH: 30.9 pg (ref 26.6–33.0)
MCHC: 33.7 g/dL (ref 31.5–35.7)
MCV: 92 fL (ref 79–97)
Monocytes Absolute: 0.5 10*3/uL (ref 0.1–0.9)
Monocytes: 5 %
Neutrophils Absolute: 6.4 10*3/uL (ref 1.4–7.0)
Neutrophils: 64 %
Platelets: 347 10*3/uL (ref 150–450)
RBC: 4.53 x10E6/uL (ref 3.77–5.28)
RDW: 12 % (ref 11.7–15.4)
WBC: 9.9 10*3/uL (ref 3.4–10.8)

## 2021-10-24 LAB — LIPID PANEL W/O CHOL/HDL RATIO
Cholesterol, Total: 190 mg/dL (ref 100–199)
HDL: 37 mg/dL — ABNORMAL LOW (ref >39)
LDL Chol Calc (NIH): 122 mg/dL — ABNORMAL HIGH (ref 0–99)
Triglycerides: 173 mg/dL — ABNORMAL HIGH (ref 0–149)
VLDL Cholesterol Cal: 31 mg/dL (ref 5–40)

## 2021-10-24 LAB — TSH: TSH: 1.28 u[IU]/mL (ref 0.450–4.500)

## 2021-10-28 ENCOUNTER — Encounter: Payer: Self-pay | Admitting: Nurse Practitioner

## 2021-10-29 MED ORDER — FLUOXETINE HCL 40 MG PO CAPS: 40.0000 mg | ORAL_CAPSULE | Freq: Every day | ORAL | 4 refills | Status: DC

## 2021-10-29 NOTE — Addendum Note (Signed)
Addended by: Aura Dials T on: 10/29/2021 11:31 AM ? ? Modules accepted: Orders ? ?

## 2021-11-01 NOTE — Patient Instructions (Incomplete)
Syncope, Adult  Syncope is when you pass out or faint for a short time. It is caused by a sudden decrease in blood flow to the brain. This can happen for many reasons. It can sometimes happen when seeing blood, getting a shot (injection), or having pain or strong emotions. Most causes of fainting are not dangerous, but in some cases it can be a sign of a serious medical problem. If you faint, get help right away. Call your local emergency services (911 in the [place] Follow these instructions at home: Watch for any changes in your symptoms. Take these actions to stay safe and help with your symptoms: Knowing when you may be about to faint Signs that you may be about to faint include: Feeling dizzy or light-headed. It may feel like the room is spinning. Feeling weak. Feeling like you may vomit (nauseous). Seeing spots or seeing all white or all black. Having cold, clammy skin. Feeling warm and sweaty. Hearing ringing in the ears. If you start to feel like you might faint, sit or lie down right away. If sitting, lower your head down between your legs. If lying down, raise (elevate) your feet above the level of your heart. Breathe deeply and steadily. Wait until all of the symptoms are gone. Have someone stay with you until you feel better. Medicines Take over-the-counter and prescription medicines only as told by your doctor. If you are taking blood pressure or heart medicine, sit up and stand up slowly. Spend a few minutes getting ready to sit and then stand. This can help you feel less dizzy. Lifestyle Do not drive, use machinery, or play sports until your doctor says it is okay. Do not drink alcohol. Do not smoke or use any products that contain nicotine or tobacco. If you need help quitting, ask your doctor. Avoid hot tubs and saunas. General instructions Talk with your doctor about your symptoms. You may need to have testing to help find the cause. Drink enough fluid to keep your pee  (urine) pale yellow. Avoid standing for a long time. If you must stand for a long time, do movements such as: Moving your legs. Crossing your legs. Flexing and stretching your leg muscles. Squatting. Keep all follow-up visits. Contact a doctor if: You have episodes of near fainting. Get help right away if: You pass out or faint. You hit your head or are injured after fainting. You have any of these symptoms: Fast or uneven heartbeats (palpitations). Pain in your chest, belly, or back. Shortness of breath. You have jerky movements that you cannot control (seizure). You have a very bad headache. You are confused. You have problems with how you see (vision). You are very weak. You have trouble walking. You are bleeding from your mouth or your butt (rectum). You have black or tarry poop (stool). These symptoms may be an emergency. Get help right away. Call your local emergency services (911 in the [place] Do not wait to see if the symptoms will go away. Do not drive yourself to the hospital. Summary Syncope is when you pass out or faint for a short time. It is caused by a sudden decrease in blood flow to the brain. Signs that you may be about to faint include feeling dizzy or light-headed, feeling like you may vomit, seeing all white or all black, or having cold, clammy skin. If you start to feel like you might faint, sit or lie down right away. Lower your head if sitting, or raise (elevate)   your feet if lying down. Breathe deeply and steadily. Wait until all of the symptoms are gone. This information is not intended to replace advice given to you by your health care provider. Make sure you discuss any questions you have with your health care provider. Document Revised: 10/23/2020 Document Reviewed: 10/23/2020 Elsevier Patient Education  2023 Elsevier Inc.  

## 2021-11-06 ENCOUNTER — Ambulatory Visit: Payer: BC Managed Care – PPO | Admitting: Nurse Practitioner

## 2021-11-26 ENCOUNTER — Ambulatory Visit (INDEPENDENT_AMBULATORY_CARE_PROVIDER_SITE_OTHER): Payer: BC Managed Care – PPO

## 2021-11-26 ENCOUNTER — Encounter: Payer: Self-pay | Admitting: Cardiology

## 2021-11-26 ENCOUNTER — Ambulatory Visit (INDEPENDENT_AMBULATORY_CARE_PROVIDER_SITE_OTHER): Payer: BC Managed Care – PPO | Admitting: Cardiology

## 2021-11-26 VITALS — BP 137/84 | HR 65 | Ht 61.0 in | Wt 185.8 lb

## 2021-11-26 DIAGNOSIS — R55 Syncope and collapse: Secondary | ICD-10-CM | POA: Diagnosis not present

## 2021-11-26 DIAGNOSIS — E7800 Pure hypercholesterolemia, unspecified: Secondary | ICD-10-CM

## 2021-11-26 DIAGNOSIS — I10 Essential (primary) hypertension: Secondary | ICD-10-CM | POA: Diagnosis not present

## 2021-11-26 NOTE — Patient Instructions (Signed)
Medication Instructions:   Your physician recommends that you continue on your current medications as directed. Please refer to the Current Medication list given to you today.  *If you need a refill on your cardiac medications before your next appointment, please call your pharmacy*   Lab Work:  None ordered   Testing/Procedures:  Your physician has requested that you have an echocardiogram in 3 weeks. Echocardiography is a painless test that uses sound waves to create images of your heart. It provides your doctor with information about the size and shape of your heart and how well your heart's chambers and valves are working. This procedure takes approximately one hour. There are no restrictions for this procedure.   2.    Your physician has recommended that you wear a Zio XT monitor for 2 weeks. This will be mailed to your home address in 4-5 business days.   This monitor is a medical device that records the heart's electrical activity. Doctors most often use these monitors to diagnose arrhythmias. Arrhythmias are problems with the speed or rhythm of the heartbeat. The monitor is a small device applied to your chest. You can wear one while you do your normal daily activities. While wearing this monitor if you have any symptoms to push the button and record what you felt. Once you have worn this monitor for the period of time provider prescribed (Usually 14 days), you will return the monitor device in the postage paid box. Once it is returned they will download the data collected and provide [place] with a report which the provider will then review and we will call you with those results. Important tips:  Avoid showering during the first 24 hours of wearing the monitor. Avoid excessive sweating to help maximize wear time. Do not submerge the device, no hot tubs, and no swimming pools. Keep any lotions or oils away from the patch. After 24 hours you may shower with the patch on. Take brief  showers with your back facing the shower head.  Do not remove patch once it has been placed because that will interrupt data and decrease adhesive wear time. Push the button when you have any symptoms and write down what you were feeling. Once you have completed wearing your monitor, remove and place into box which has postage paid and place in your outgoing mailbox.  If for some reason you have misplaced your box then call our office and we can provide another box and/or mail it off for you.      Follow-Up: At [facility], you and your health needs are our priority.  As part of our continuing mission to provide you with exceptional heart care, we have created designated Provider Care Teams.  These Care Teams include your primary Cardiologist (physician) and Advanced Practice Providers (APPs -  Physician Assistants and Nurse Practitioners) who all work together to provide you with the care you need, when you need it.  We recommend signing up for the patient portal called "MyChart".  Sign up information is provided on this After Visit Summary.  MyChart is used to connect with patients for Virtual Visits (Telemedicine).  Patients are able to view lab/test results, encounter notes, upcoming appointments, etc.  Non-urgent messages can be sent to your provider as well.   To learn more about what you can do with MyChart, go to [email or web address].    Your next appointment:   6-8 week(s)  The format for your next appointment:   In Person  Provider:  You may see Debbe Odea, MD or one of the following Advanced Practice Providers on your designated Care Team:   Nicolasa Ducking, NP Eula Listen, PA-C Cadence Fransico Michael, [place]    Other Instructions   Important Information About Sugar

## 2021-11-26 NOTE — Progress Notes (Signed)
Cardiology Office Note:    Date:  11/26/2021   ID:  Tiffany Odom, DOB Feb 23, 1976, MRN [id number]  PCP:  Marjie Skiffannady, Jolene T, NP   [facility] Providers Cardiologist:  None     Referring MD: Dorcas CarrowJohnson, Megan P, DO   No chief complaint on file.  Tiffany Odom is a 46 y.o. female who is being seen today for the evaluation of syncope at the request of Dorcas CarrowJohnson, Megan P, DO.   History of Present Illness:    Tiffany Odom is a 46 y.o. female with a hx of hypertension, hyperlipidemia, diabetes who presented to the syncope.  Patient had an episode of passing out about 3 weeks ago.  She states waking up and felt dizzy/sluggish that day.  Did not make much of this, sat down to put on her socks as she usually does and fell forwards.  States pressing on for couple of seconds, was found by her daughter who helped patient to a chair.  EMS was called, patient evaluated was not taken to the emergency room as symptoms were improved.  She states feeling dizzy throughout her day.  Denies any prior episodes of passing out, endorses occasional dizziness since then,  usually when standing.  Denies palpitations, chest pain.  Past Medical History:  Diagnosis Date   Depression    Diabetes (HCC)    Hyperlipemia    Hypertension     Past Surgical History:  Procedure Laterality Date   ABDOMINAL HYSTERECTOMY     BILATERAL CARPAL TUNNEL RELEASE     CESAREAN SECTION     CHOLECYSTECTOMY      Current Medications: Current Meds  Medication Sig   atorvastatin (LIPITOR) 80 MG tablet Take 1 tablet (80 mg total) by mouth daily.   FLUoxetine (PROZAC) 40 MG capsule Take 1 capsule (40 mg total) by mouth daily.   irbesartan (AVAPRO) 150 MG tablet Take by mouth.     Allergies:   Oxycodone-acetaminophen, Sulfamethoxazole-trimethoprim, Lisinopril, Rosuvastatin, and Cyclobenzaprine   Social History   Socioeconomic History   Marital status: Divorced    Spouse name: Not on file   Number of children: Not on file    Years of education: Not on file   Highest education level: Not on file  Occupational History   Not on file  Tobacco Use   Smoking status: Never   Smokeless tobacco: Never  Vaping Use   Vaping Use: Never used  Substance and Sexual Activity   Alcohol use: Yes    Comment: OCC   Drug use: Never   Sexual activity: Not on file  Other Topics Concern   Not on file  Social History Narrative   Not on file   Social Determinants of Health   Financial Resource Strain: Low Risk    Difficulty of Paying Living Expenses: Not hard at all  Food Insecurity: No Food Insecurity   Worried About Programme researcher, broadcasting/film/videounning Out of Food in the Last Year: Never true   Ran Out of Food in the Last Year: Never true  Transportation Needs: No Transportation Needs   Lack of Transportation (Medical): No   Lack of Transportation (Non-Medical): No  Physical Activity: Insufficiently Active   Days of Exercise per Week: 4 days   Minutes of Exercise per Session: 30 min  Stress: No Stress Concern Present   Feeling of Stress : Only a little  Social Connections: Socially Isolated   Frequency of Communication with Friends and Family: Three times a week   Frequency of Social Gatherings with Friends  and Family: Three times a week   Attends Religious Services: Never   Active Member of Clubs or Organizations: Not on file   Attends Banker Meetings: Never   Marital Status: Separated     Family History: The patient's family history includes ADD / ADHD in her mother; Diabetes in her father, maternal grandmother, mother, and paternal grandmother; Healthy in her brother, sister, sister, sister, sister, and sister; Heart Problems in her father, maternal grandmother, mother, and paternal grandmother; Sleep apnea in her mother.  ROS:   Please see the history of present illness.     All other systems reviewed and are negative.  EKGs/Labs/Other Studies Reviewed:    The following studies were reviewed today:   EKG:  EKG is   ordered today.  The ekg ordered today demonstrates normal sinus rhythm, normal ECG  Recent Labs: 10/23/2021: ALT 25; BUN 18; Creatinine, Ser 0.77; Hemoglobin 14.0; Platelets 347; Potassium 4.8; Sodium 140; TSH 1.280  Recent Lipid Panel    Component Value Date/Time   CHOL 190 10/23/2021 0944   TRIG 173 (H) 10/23/2021 0944   HDL 37 (L) 10/23/2021 0944   LDLCALC 122 (H) 10/23/2021 0944     Risk Assessment/Calculations:         Physical Exam:    VS:  BP 137/84   Pulse 65   Ht 5\' 1"  (1.549 m)   Wt 185 lb 12.8 oz (84.3 kg)   SpO2 96%   BMI 35.11 kg/m     Wt Readings from Last 3 Encounters:  11/26/21 185 lb 12.8 oz (84.3 kg)  10/23/21 189 lb 6.4 oz (85.9 kg)  08/06/21 190 lb 3.2 oz (86.3 kg)     GEN:  Well nourished, well developed in no acute distress HEENT: Normal NECK: No JVD; No carotid bruits LYMPHATICS: No lymphadenopathy CARDIAC: RRR, no murmurs, rubs, gallops RESPIRATORY:  Clear to auscultation without rales, wheezing or rhonchi  ABDOMEN: Soft, non-tender, non-distended MUSCULOSKELETAL:  No edema; No deformity  SKIN: Warm and dry NEUROLOGIC:  Alert and oriented x 3 PSYCHIATRIC:  Normal affect   ASSESSMENT:    1. Syncope and collapse   2. Primary hypertension   3. Pure hypercholesterolemia    PLAN:    In order of problems listed above:  Syncope, orthostatic vitals today with no evidence for orthostasis.  Symptoms appear noncardiac,?  Neurogenic versus vasovagal.  Will complete cardiac work-up with 2-week cardiac monitor, obtain echocardiogram. Hypertension, BP controlled.  Continue irbesartan Hyperlipidemia, Lipitor recently increased, continue Lipitor 80, Zetia.  Follow-up after echo and cardiac monitor.      Medication Adjustments/Labs and Tests Ordered: Current medicines are reviewed at length with the patient today.  Concerns regarding medicines are outlined above.  Orders Placed This Encounter  Procedures   LONG TERM MONITOR (3-14 DAYS)   EKG  12-Lead   ECHOCARDIOGRAM COMPLETE   No orders of the defined types were placed in this encounter.   Patient Instructions  Medication Instructions:   Your physician recommends that you continue on your current medications as directed. Please refer to the Current Medication list given to you today.  *If you need a refill on your cardiac medications before your next appointment, please call your pharmacy*   Lab Work:  None ordered   Testing/Procedures:  Your physician has requested that you have an echocardiogram in 3 weeks. Echocardiography is a painless test that uses sound waves to create images of your heart. It provides your doctor with information about the size  and shape of your heart and how well your heart's chambers and valves are working. This procedure takes approximately one hour. There are no restrictions for this procedure.   2.    Your physician has recommended that you wear a Zio XT monitor for 2 weeks. This will be mailed to your home address in 4-5 business days.   This monitor is a medical device that records the heart's electrical activity. Doctors most often use these monitors to diagnose arrhythmias. Arrhythmias are problems with the speed or rhythm of the heartbeat. The monitor is a small device applied to your chest. You can wear one while you do your normal daily activities. While wearing this monitor if you have any symptoms to push the button and record what you felt. Once you have worn this monitor for the period of time provider prescribed (Usually 14 days), you will return the monitor device in the postage paid box. Once it is returned they will download the data collected and provide [place] with a report which the provider will then review and we will call you with those results. Important tips:  Avoid showering during the first 24 hours of wearing the monitor. Avoid excessive sweating to help maximize wear time. Do not submerge the device, no hot tubs, and no  swimming pools. Keep any lotions or oils away from the patch. After 24 hours you may shower with the patch on. Take brief showers with your back facing the shower head.  Do not remove patch once it has been placed because that will interrupt data and decrease adhesive wear time. Push the button when you have any symptoms and write down what you were feeling. Once you have completed wearing your monitor, remove and place into box which has postage paid and place in your outgoing mailbox.  If for some reason you have misplaced your box then call our office and we can provide another box and/or mail it off for you.      Follow-Up: At [facility], you and your health needs are our priority.  As part of our continuing mission to provide you with exceptional heart care, we have created designated Provider Care Teams.  These Care Teams include your primary Cardiologist (physician) and Advanced Practice Providers (APPs -  Physician Assistants and Nurse Practitioners) who all work together to provide you with the care you need, when you need it.  We recommend signing up for the patient portal called "MyChart".  Sign up information is provided on this After Visit Summary.  MyChart is used to connect with patients for Virtual Visits (Telemedicine).  Patients are able to view lab/test results, encounter notes, upcoming appointments, etc.  Non-urgent messages can be sent to your provider as well.   To learn more about what you can do with MyChart, go to [email or web address].    Your next appointment:   6-8 week(s)  The format for your next appointment:   In Person  Provider:   You may see Debbe Odea, MD or one of the following Advanced Practice Providers on your designated Care Team:   Nicolasa Ducking, NP Eula Listen, PA-C Cadence Fransico Michael, [place]    Other Instructions   Important Information About Sugar         Signed, Debbe Odea, MD  11/26/2021 12:31 PM    [facility]

## 2021-11-29 DIAGNOSIS — R55 Syncope and collapse: Secondary | ICD-10-CM

## 2021-12-18 ENCOUNTER — Ambulatory Visit (INDEPENDENT_AMBULATORY_CARE_PROVIDER_SITE_OTHER): Payer: BC Managed Care – PPO

## 2021-12-18 DIAGNOSIS — R55 Syncope and collapse: Secondary | ICD-10-CM

## 2021-12-18 LAB — ECHOCARDIOGRAM COMPLETE
Area-P 1/2: 3.76 cm2
S' Lateral: 3 cm

## 2022-01-15 ENCOUNTER — Ambulatory Visit: Payer: BC Managed Care – PPO | Admitting: Cardiology

## 2022-02-03 ENCOUNTER — Ambulatory Visit: Payer: BC Managed Care – PPO | Admitting: Nurse Practitioner

## 2022-02-10 ENCOUNTER — Ambulatory Visit: Payer: BC Managed Care – PPO | Admitting: Nurse Practitioner

## 2022-02-10 DIAGNOSIS — E559 Vitamin D deficiency, unspecified: Secondary | ICD-10-CM

## 2022-02-10 DIAGNOSIS — E1169 Type 2 diabetes mellitus with other specified complication: Secondary | ICD-10-CM

## 2022-02-10 DIAGNOSIS — G4733 Obstructive sleep apnea (adult) (pediatric): Secondary | ICD-10-CM

## 2022-02-10 DIAGNOSIS — E1159 Type 2 diabetes mellitus with other circulatory complications: Secondary | ICD-10-CM

## 2022-02-10 DIAGNOSIS — F419 Anxiety disorder, unspecified: Secondary | ICD-10-CM

## 2022-02-14 NOTE — Patient Instructions (Signed)
Diabetes Mellitus Basics  Diabetes mellitus, or diabetes, is a long-term (chronic) disease. It occurs when the body does not properly use sugar (glucose) that is released from food after you eat. Diabetes mellitus may be caused by one or both of these problems: Your pancreas does not make enough of a hormone called insulin. Your body does not react in a normal way to the insulin that it makes. Insulin lets glucose enter cells in your body. This gives you energy. If you have diabetes, glucose cannot get into cells. This causes high blood glucose (hyperglycemia). How to treat and manage diabetes You may need to take insulin or other diabetes medicines daily to keep your glucose in balance. If you are prescribed insulin, you will learn how to give yourself insulin by injection. You may need to adjust the amount of insulin you take based on the foods that you eat. You will need to check your blood glucose levels using a glucose monitor as told by your health care provider. The readings can help determine if you have low or high blood glucose. Generally, you should have these blood glucose levels: Before meals (preprandial): 80-130 mg/dL (4.4-7.2 mmol/L). After meals (postprandial): below 180 mg/dL (10 mmol/L). Hemoglobin A1c (HbA1c) level: less than 7%. Your health care provider will set treatment goals for you. Keep all follow-up visits. This is important. Follow these instructions at home: Diabetes medicines Take your diabetes medicines every day as told by your health care provider. List your diabetes medicines here: Name of medicine: ______________________________ Amount (dose): _______________ Time (a.m./p.m.): _______________ Notes: ___________________________________ Name of medicine: ______________________________ Amount (dose): _______________ Time (a.m./p.m.): _______________ Notes: ___________________________________ Name of medicine: ______________________________ Amount (dose):  _______________ Time (a.m./p.m.): _______________ Notes: ___________________________________ Insulin If you use insulin, list the types of insulin you use here: Insulin type: ______________________________ Amount (dose): _______________ Time (a.m./p.m.): _______________Notes: ___________________________________ Insulin type: ______________________________ Amount (dose): _______________ Time (a.m./p.m.): _______________ Notes: ___________________________________ Insulin type: ______________________________ Amount (dose): _______________ Time (a.m./p.m.): _______________ Notes: ___________________________________ Insulin type: ______________________________ Amount (dose): _______________ Time (a.m./p.m.): _______________ Notes: ___________________________________ Insulin type: ______________________________ Amount (dose): _______________ Time (a.m./p.m.): _______________ Notes: ___________________________________ Managing blood glucose  Check your blood glucose levels using a glucose monitor as told by your health care provider. Write down the times that you check your glucose levels here: Time: _______________ Notes: ___________________________________ Time: _______________ Notes: ___________________________________ Time: _______________ Notes: ___________________________________ Time: _______________ Notes: ___________________________________ Time: _______________ Notes: ___________________________________ Time: _______________ Notes: ___________________________________  Low blood glucose Low blood glucose (hypoglycemia) is when glucose is at or below 70 mg/dL (3.9 mmol/L). Symptoms may include: Feeling: Hungry. Sweaty and clammy. Irritable or easily upset. Dizzy. Sleepy. Having: A fast heartbeat. A headache. A change in your vision. Numbness around the mouth, lips, or tongue. Having trouble with: Moving (coordination). Sleeping. Treating low blood glucose To treat low blood  glucose, eat or drink something containing sugar right away. If you can think clearly and swallow safely, follow the 15:15 rule: Take 15 grams of a fast-acting carb (carbohydrate), as told by your health care provider. Some fast-acting carbs are: Glucose tablets: take 3-4 tablets. Hard candy: eat 3-5 pieces. Fruit juice: drink 4 oz (120 mL). Regular (not diet) soda: drink 4-6 oz (120-180 mL). Honey or sugar: eat 1 Tbsp (15 mL). Check your blood glucose levels 15 minutes after you take the carb. If your glucose is still at or below 70 mg/dL (3.9 mmol/L), take 15 grams of a carb again. If your glucose does not go above 70 mg/dL (3.9 mmol/L) after   3 tries, get help right away. After your glucose goes back to normal, eat a meal or a snack within 1 hour. Treating very low blood glucose If your glucose is at or below 54 mg/dL (3 mmol/L), you have very low blood glucose (severe hypoglycemia). This is an emergency. Do not wait to see if the symptoms will go away. Get medical help right away. Call your local emergency services (911 in the [place] Do not drive yourself to the hospital. Questions to ask your health care provider Should I talk with a diabetes educator? What equipment will I need to care for myself at home? What diabetes medicines do I need? When should I take them? How often do I need to check my blood glucose levels? What number can I call if I have questions? When is my follow-up visit? Where can I find a support group for people with diabetes? Where to find more information American Diabetes Association: www.diabetes.org Association of Diabetes Care and Education Specialists: www.diabeteseducator.org Contact a health care provider if: Your blood glucose is at or above 240 mg/dL (13.3 mmol/L) for 2 days in a row. You have been sick or have had a fever for 2 days or more, and you are not getting better. You have any of these problems for more than 6 hours: You cannot eat or  drink. You feel nauseous. You vomit. You have diarrhea. Get help right away if: Your blood glucose is lower than 54 mg/dL (3 mmol/L). You get confused. You have trouble thinking clearly. You have trouble breathing. These symptoms may represent a serious problem that is an emergency. Do not wait to see if the symptoms will go away. Get medical help right away. Call your local emergency services (911 in the [place] Do not drive yourself to the hospital. Summary Diabetes mellitus is a chronic disease that occurs when the body does not properly use sugar (glucose) that is released from food after you eat. Take insulin and diabetes medicines as told. Check your blood glucose every day, as often as told. Keep all follow-up visits. This is important. This information is not intended to replace advice given to you by your health care provider. Make sure you discuss any questions you have with your health care provider. Document Revised: 10/16/2019 Document Reviewed: 10/16/2019 Elsevier Patient Education  2023 [employer]vier Inc.  

## 2022-02-17 ENCOUNTER — Encounter: Payer: Self-pay | Admitting: Nurse Practitioner

## 2022-02-17 ENCOUNTER — Ambulatory Visit (INDEPENDENT_AMBULATORY_CARE_PROVIDER_SITE_OTHER): Payer: BC Managed Care – PPO | Admitting: Nurse Practitioner

## 2022-02-17 DIAGNOSIS — E1159 Type 2 diabetes mellitus with other circulatory complications: Secondary | ICD-10-CM | POA: Diagnosis not present

## 2022-02-17 DIAGNOSIS — F419 Anxiety disorder, unspecified: Secondary | ICD-10-CM | POA: Diagnosis not present

## 2022-02-17 DIAGNOSIS — G4733 Obstructive sleep apnea (adult) (pediatric): Secondary | ICD-10-CM

## 2022-02-17 DIAGNOSIS — E785 Hyperlipidemia, unspecified: Secondary | ICD-10-CM

## 2022-02-17 DIAGNOSIS — E1169 Type 2 diabetes mellitus with other specified complication: Secondary | ICD-10-CM | POA: Diagnosis not present

## 2022-02-17 DIAGNOSIS — I152 Hypertension secondary to endocrine disorders: Secondary | ICD-10-CM | POA: Diagnosis not present

## 2022-02-17 DIAGNOSIS — E559 Vitamin D deficiency, unspecified: Secondary | ICD-10-CM | POA: Diagnosis not present

## 2022-02-17 DIAGNOSIS — L989 Disorder of the skin and subcutaneous tissue, unspecified: Secondary | ICD-10-CM

## 2022-02-17 DIAGNOSIS — F32A Depression, unspecified: Secondary | ICD-10-CM

## 2022-02-17 LAB — BAYER DCA HB A1C WAIVED: HB A1C (BAYER DCA - WAIVED): 6.4 % — ABNORMAL HIGH (ref 4.8–5.6)

## 2022-02-17 MED ORDER — IRBESARTAN 150 MG PO TABS
150.0000 mg | ORAL_TABLET | Freq: Every day | ORAL | 4 refills | Status: DC
Start: 2022-02-17 — End: 2022-08-25

## 2022-02-17 NOTE — Assessment & Plan Note (Signed)
Chronic, ongoing with A1c 6.4% and urine ALB 07 August 2021.  At this time will continue diet control, discussed with patient options available if elevation in future; such as GLP1, SGLT2, or Metformin.  She is working on diet changes and regular walks.  Recommend she check blood sugar at home a few days a week with goal in morning fasting <130 and goal 2 hours after eating <180.  Return in 6 months.

## 2022-02-17 NOTE — Assessment & Plan Note (Signed)
BMI 35.56 with T2DM, HTN/HLD.  Recommended eating smaller high protein, low fat meals more frequently and exercising 30 mins a day 5 times a week with a goal of 10-15lb weight loss in the next 3 months. Patient voiced their understanding and motivation to adhere to these recommendations.

## 2022-02-17 NOTE — Assessment & Plan Note (Signed)
Noted on past labs, she is taking supplement.  Recheck levels today. 

## 2022-02-17 NOTE — Assessment & Plan Note (Signed)
Chronic, stable, denies SI/HI.  Has been on Prozac with benefit for years, will continue at 40 MG at this time as is offering benefit.   

## 2022-02-17 NOTE — Progress Notes (Signed)
BP 137/79   Pulse 66   Temp 98.9 F (37.2 C) (Oral)   Ht 5\' 1"  (1.549 m)   Wt 188 lb 3.2 oz (85.4 kg)   SpO2 97%   BMI 35.56 kg/m    Subjective:    Patient ID: , female    DOB: September 11, 1975, 46 y.o.   MRN: [id number]  HPI: Tiffany Odom is a 46 y.o. female  Chief Complaint  Patient presents with   Diabetes    Patient declines having a recent Diabetic Eye Exam.    Hyperlipidemia   Hypertension   Mood   DIABETES Last A1c was 5.7% in April. Diet-controlled at this time.  Continues on Vitamin D daily for history of low levels. Hypoglycemic episodes:no Polydipsia/polyuria: no Visual disturbance: no Chest pain: no Paresthesias: no Glucose Monitoring: no             Accucheck frequency: Not Checking -- can not afford at this time             Fasting glucose:             Post prandial:             Evening:             Before meals: Taking Insulin?: no             Long acting insulin:             Short acting insulin: Blood Pressure Monitoring: rarely Retinal Examination: Not up to Date Foot Exam: Up to Date Pneumovax: Up To Date Influenza: Up To Date Aspirin: no    HYPERTENSION / HYPERLIPIDEMIA Currently taking Atorvastatin and Irbesartan daily. Had cough with Lisinopril after being on this for 10 years.  Rosuvastatin caused itchiness.  Not taking Zetia, could not pick up. Satisfied with current treatment? yes Duration of hypertension: chronic BP monitoring frequency: rarely BP range: 130/70 range BP medication side effects: no Past BP meds: Lisinopril Duration of hyperlipidemia: chronic Cholesterol medication side effects: no Cholesterol supplements: none Past cholesterol medications: Rosuvastatin Medication compliance: good compliance Aspirin: no Recent stressors: no Recurrent headaches: no Visual changes: no Palpitations: no Dyspnea: no Chest pain: no Lower extremity edema: no Dizzy/lightheaded: no    SLEEP APNEA Was tested and noted  to have moderate OSA - not using CPAP, felt like was suffocating with facial mask and is mouth breather so nasal mask did not work.  Working with [facility] neurology. Sleep apnea status: stable Duration: one year Wakes feeling refreshed:  no Daytime hypersomnolence:  yes Fatigue:  yes Insomnia:   varies Good sleep hygiene:  yes Difficulty falling asleep:  yes Difficulty staying asleep:  yes Snoring bothers bed partner:  no Observed apnea by bed partner: no Obesity:  yes Hypertension: yes  Pulmonary hypertension:  no Coronary artery disease:  no   SKIN LESION To left inner thigh -- bleeds some at times if she picks at.   Duration: months Location: left inner thigh Painful: no Itching: yes Onset: gradual Context: bigger Associated signs and symptoms: bigger in size History of skin cancer: no History of precancerous skin lesions: no Family history of skin cancer: no    DEPRESSION Takes Prozac 40 MG daily for depression daily, been on for >14 years.  She feels 65 G offering more benefit. Mood status: stable Satisfied with current treatment?: yes Symptom severity: moderate  Duration of current treatment : chronic Side effects: no Medication compliance: good compliance Psychotherapy/counseling: yes in past Previous  psychiatric medications: none Depressed mood: occasional Anxious mood: occasional Anhedonia: no Significant weight loss or gain: no Insomnia: no Fatigue: improved Feelings of worthlessness or guilt: no Impaired concentration/indecisiveness: no Suicidal ideations: no Hopelessness: no Crying spells: no    02/17/2022    9:53 AM 10/23/2021    9:12 AM 08/06/2021    8:12 AM 05/01/2021    4:19 PM 04/07/2021    3:39 PM  Depression screen PHQ 2/9  Decreased Interest 2 2 2 2  0  Down, Depressed, Hopeless 1 1 2 2 1   PHQ - 2 Score 3 3 4 4 1   Altered sleeping 1 2 3 1  0  Tired, decreased energy 3 2 3 3 3   Change in appetite 2 3 2 1  0  Feeling bad or failure about  yourself  2 2 2  0 0  Trouble concentrating 1 2 2  0 1  Moving slowly or fidgety/restless 0 0 0 0 0  Suicidal thoughts 1 0 0 0 0  PHQ-9 Score 13 14 16 9 5   Difficult doing work/chores Somewhat difficult   Not difficult at all Somewhat difficult       02/17/2022    9:54 AM 10/23/2021    9:13 AM 08/06/2021    8:12 AM 05/01/2021    4:20 PM  GAD 7 : Generalized Anxiety Score  Nervous, Anxious, on Edge 1 2 2    Control/stop worrying 2 2 3 2   Worry too much - different things 2 2 2 2   Trouble relaxing 1 2 3 2   Restless 1 1 1 1   Easily annoyed or irritable 1 2 3 2   Afraid - awful might happen 2 2 3 1   Total GAD 7 Score 10 13 17    Anxiety Difficulty Somewhat difficult Somewhat difficult Somewhat difficult Somewhat difficult   Relevant past medical, surgical, family and social history reviewed and updated as indicated. Interim medical history since our last visit reviewed. Allergies and medications reviewed and updated.  Review of Systems  Constitutional:  Negative for activity change, appetite change, diaphoresis, fatigue and fever.  Respiratory:  Negative for cough, chest tightness and shortness of breath.   Cardiovascular:  Negative for chest pain, palpitations and leg swelling.  Gastrointestinal: Negative.   Endocrine: Negative for cold intolerance, heat intolerance, polydipsia, polyphagia and polyuria.  Neurological: Negative.   Psychiatric/Behavioral: Negative.      Per HPI unless specifically indicated above     Objective:    BP 137/79   Pulse 66   Temp 98.9 F (37.2 C) (Oral)   Ht 5\' 1"  (1.549 m)   Wt 188 lb 3.2 oz (85.4 kg)   SpO2 97%   BMI 35.56 kg/m   Wt Readings from Last 3 Encounters:  02/17/22 188 lb 3.2 oz (85.4 kg)  11/26/21 185 lb 12.8 oz (84.3 kg)  10/23/21 189 lb 6.4 oz (85.9 kg)    Physical Exam Vitals and nursing note reviewed.  Constitutional:      General: She is awake. She is not in acute distress.    Appearance: She is well-developed and  well-groomed. She is obese. She is not ill-appearing or toxic-appearing.  HENT:     Head: Normocephalic.     Right Ear: Hearing normal.     Left Ear: Hearing normal.  Eyes:     General: Lids are normal.        Right eye: No discharge.        Left eye: No discharge.     Conjunctiva/sclera: Conjunctivae normal.  Pupils: Pupils are equal, round, and reactive to light.  Neck:     Thyroid: No thyromegaly.     Vascular: No carotid bruit.  Cardiovascular:     Rate and Rhythm: Normal rate and regular rhythm.     Heart sounds: Normal heart sounds. No murmur heard.    No gallop.  Pulmonary:     Effort: Pulmonary effort is normal. No accessory muscle usage or respiratory distress.     Breath sounds: Normal breath sounds.  Abdominal:     General: Bowel sounds are normal.     Palpations: Abdomen is soft.  Musculoskeletal:     Cervical back: Normal range of motion and neck supple.     Right lower leg: No edema.     Left lower leg: No edema.  Lymphadenopathy:     Cervical: No cervical adenopathy.  Skin:    General: Skin is warm and dry.     Findings: Lesion present.       Neurological:     Mental Status: She is alert and oriented to person, place, and time.  Psychiatric:        Attention and Perception: Attention normal.        Mood and Affect: Mood normal.        Speech: Speech normal.        Behavior: Behavior normal. Behavior is cooperative.        Thought Content: Thought content normal.     Results for orders placed or performed in visit on 02/17/22  Bayer DCA Hb A1c Waived  Result Value Ref Range   HB A1C (BAYER DCA - WAIVED) 6.4 (H) 4.8 - 5.6 %      Assessment & Plan:   Problem List Items Addressed This Visit       Cardiovascular and Mediastinum   Hypertension associated with diabetes (HCC)    Chronic, ongoing with BP at goal in office today.  Recommend she monitor BP at least a few mornings a week at home and document.  DASH diet at home.  Continue current  medication regimen and adjust as needed.  Labs today: CMP and lipid.  Return in 6 months.        Relevant Medications   irbesartan (AVAPRO) 150 MG tablet   Other Relevant Orders   Bayer DCA Hb A1c Waived (Completed)   Comprehensive metabolic panel     Respiratory   OSA (obstructive sleep apnea)    Not using CPAP, did not tolerate masks -- recommend she reach out to sleep provider to discuss as would benefit from treatment.        Endocrine   Hyperlipidemia associated with type 2 diabetes mellitus (HCC)    Chronic, ongoing.  Continue current medication regimen and adjust as needed.  Lipid panel today.         Relevant Medications   irbesartan (AVAPRO) 150 MG tablet   Other Relevant Orders   Bayer DCA Hb A1c Waived (Completed)   Comprehensive metabolic panel   Lipid Panel w/o Chol/HDL Ratio   Type 2 diabetes mellitus with morbid obesity (HCC) - Primary    Chronic, ongoing with A1c 6.4% and urine ALB 07 August 2021.  At this time will continue diet control, discussed with patient options available if elevation in future; such as GLP1, SGLT2, or Metformin.  She is working on diet changes and regular walks.  Recommend she check blood sugar at home a few days a week with goal in morning fasting <130 and  goal 2 hours after eating <180.  Return in 6 months.        Relevant Medications   irbesartan (AVAPRO) 150 MG tablet   Other Relevant Orders   Bayer DCA Hb A1c Waived (Completed)   Comprehensive metabolic panel     Other   Anxiety and depression    Chronic, stable, denies SI/HI.  Has been on Prozac with benefit for years, will continue at 40 MG at this time as is offering benefit.        Morbid obesity (HCC)    BMI 35.56 with T2DM, HTN/HLD.  Recommended eating smaller high protein, low fat meals more frequently and exercising 30 mins a day 5 times a week with a goal of 10-15lb weight loss in the next 3 months. Patient voiced their understanding and motivation to adhere to these  recommendations.       Vitamin D deficiency    Noted on past labs, she is taking supplement.  Recheck levels today.      Relevant Orders   VITAMIN D 25 Hydroxy (Vit-D Deficiency, Fractures)   Other Visit Diagnoses     Skin lesion       Referral to dermatology.   Relevant Orders   Ambulatory referral to Dermatology        Follow up plan: Return in about 6 months (around 08/20/2022) for Annual physical.

## 2022-02-17 NOTE — Assessment & Plan Note (Signed)
Not using CPAP, did not tolerate masks -- recommend she reach out to sleep provider to discuss as would benefit from treatment.

## 2022-02-17 NOTE — Assessment & Plan Note (Signed)
Chronic, ongoing with BP at goal in office today.  Recommend she monitor BP at least a few mornings a week at home and document.  DASH diet at home.  Continue current medication regimen and adjust as needed.  Labs today: CMP and lipid.  Return in 6 months.

## 2022-02-17 NOTE — Assessment & Plan Note (Signed)
Chronic, ongoing.  Continue current medication regimen and adjust as needed. Lipid panel today. 

## 2022-02-18 ENCOUNTER — Other Ambulatory Visit: Payer: Self-pay | Admitting: Nurse Practitioner

## 2022-02-18 LAB — COMPREHENSIVE METABOLIC PANEL
ALT: 20 IU/L (ref 0–32)
AST: 18 IU/L (ref 0–40)
Albumin/Globulin Ratio: 1.7 (ref 1.2–2.2)
Albumin: 4.3 g/dL (ref 3.9–4.9)
Alkaline Phosphatase: 76 IU/L (ref 44–121)
BUN/Creatinine Ratio: 14 (ref 9–23)
BUN: 10 mg/dL (ref 6–24)
Bilirubin Total: 0.6 mg/dL (ref 0.0–1.2)
CO2: 24 mmol/L (ref 20–29)
Calcium: 9.7 mg/dL (ref 8.7–10.2)
Chloride: 101 mmol/L (ref 96–106)
Creatinine, Ser: 0.72 mg/dL (ref 0.57–1.00)
Globulin, Total: 2.5 g/dL (ref 1.5–4.5)
Glucose: 117 mg/dL — ABNORMAL HIGH (ref 70–99)
Potassium: 4.4 mmol/L (ref 3.5–5.2)
Sodium: 139 mmol/L (ref 134–144)
Total Protein: 6.8 g/dL (ref 6.0–8.5)
eGFR: 105 mL/min/{1.73_m2} (ref >59)

## 2022-02-18 LAB — LIPID PANEL W/O CHOL/HDL RATIO
Cholesterol, Total: 221 mg/dL — ABNORMAL HIGH (ref 100–199)
HDL: 37 mg/dL — ABNORMAL LOW (ref >39)
LDL Chol Calc (NIH): 157 mg/dL — ABNORMAL HIGH (ref 0–99)
Triglycerides: 146 mg/dL (ref 0–149)
VLDL Cholesterol Cal: 27 mg/dL (ref 5–40)

## 2022-02-18 LAB — VITAMIN D 25 HYDROXY (VIT D DEFICIENCY, FRACTURES): Vit D, 25-Hydroxy: 33 ng/mL (ref 30.0–100.0)

## 2022-02-18 MED ORDER — EZETIMIBE 10 MG PO TABS: 10.0000 mg | ORAL_TABLET | Freq: Every day | ORAL | 4 refills | Status: DC

## 2022-02-18 NOTE — Progress Notes (Signed)
Contacted via MyChart   Good day Tiffany Odom, your labs have returned: - Kidney function, creatinine and eGFR, remains normal, as is liver function, AST and ALT.   - Vitamin D level normal, continue supplement. - Cholesterol labs are quite elevated -- are you taking Atorvastatin 80 MG daily?  If not please ensure you take this every day.  If you are taking then please add on the Zetia I have sent in to help get these levels down.  Goal is LDL <70.  Any questions? Keep being amazing!!  Thank you for allowing me to participate in your care.  I appreciate you. Kindest regards, Paula Busenbark

## 2022-03-17 ENCOUNTER — Encounter: Payer: Self-pay | Admitting: Nurse Practitioner

## 2022-03-17 ENCOUNTER — Telehealth (INDEPENDENT_AMBULATORY_CARE_PROVIDER_SITE_OTHER): Payer: BC Managed Care – PPO | Admitting: Nurse Practitioner

## 2022-03-17 ENCOUNTER — Telehealth: Payer: Self-pay

## 2022-03-17 DIAGNOSIS — G43109 Migraine with aura, not intractable, without status migrainosus: Secondary | ICD-10-CM

## 2022-03-17 MED ORDER — AMITRIPTYLINE HCL 10 MG PO TABS: 10.0000 mg | ORAL_TABLET | Freq: Every day | ORAL | 0 refills | Status: DC

## 2022-03-17 MED ORDER — NURTEC 75 MG PO TBDP: 75.0000 mg | ORAL_TABLET | Freq: Every day | ORAL | 0 refills | Status: DC | PRN

## 2022-03-17 NOTE — Telephone Encounter (Signed)
Prior authorization was denied by patient insurance.

## 2022-03-17 NOTE — Progress Notes (Signed)
LVM asking patient to call back to schedule an appointment 

## 2022-03-17 NOTE — Patient Instructions (Signed)
Migraine Headache ?A migraine headache is a very strong throbbing pain on one side or both sides of your head. This type of headache can also cause other symptoms. It can last from 4 hours to 3 days. Talk with your doctor about what things may bring on (trigger) this condition. ?What are the causes? ?The exact cause of this condition is not known. This condition may be triggered or caused by: ?Drinking alcohol. ?Smoking. ?Taking medicines, such as: ?Medicine used to treat chest pain (nitroglycerin). ?Birth control pills. ?Estrogen. ?Some blood pressure medicines. ?Eating or drinking certain products. ?Doing physical activity. ?Other things that may trigger a migraine headache include: ?Having a menstrual period. ?Pregnancy. ?Hunger. ?Stress. ?Not getting enough sleep or getting too much sleep. ?Weather changes. ?Tiredness (fatigue). ?What increases the risk? ?Being 25-55 years old. ?Being female. ?Having a family history of migraine headaches. ?Being Caucasian. ?Having depression or anxiety. ?Being very overweight. ?What are the signs or symptoms? ?A throbbing pain. This pain may: ?Happen in any area of the head, such as on one side or both sides. ?Make it hard to do daily activities. ?Get worse with physical activity. ?Get worse around bright lights or loud noises. ?Other symptoms may include: ?Feeling sick to your stomach (nauseous). ?Vomiting. ?Dizziness. ?Being sensitive to bright lights, loud noises, or smells. ?Before you get a migraine headache, you may get warning signs (an aura). An aura may include: ?Seeing flashing lights or having blind spots. ?Seeing bright spots, halos, or zigzag lines. ?Having tunnel vision or blurred vision. ?Having numbness or a tingling feeling. ?Having trouble talking. ?Having weak muscles. ?Some people have symptoms after a migraine headache (postdromal phase), such as: ?Tiredness. ?Trouble thinking (concentrating). ?How is this treated? ?Taking medicines that: ?Relieve  pain. ?Relieve the feeling of being sick to your stomach. ?Prevent migraine headaches. ?Treatment may also include: ?Having acupuncture. ?Avoiding foods that bring on migraine headaches. ?Learning ways to control your body functions (biofeedback). ?Therapy to help you know and deal with negative thoughts (cognitive behavioral therapy). ?Follow these instructions at home: ?Medicines ?Take over-the-counter and prescription medicines only as told by your doctor. ?Ask your doctor if the medicine prescribed to you: ?Requires you to avoid driving or using heavy machinery. ?Can cause trouble pooping (constipation). You may need to take these steps to prevent or treat trouble pooping: ?Drink enough fluid to keep your pee (urine) pale yellow. ?Take over-the-counter or prescription medicines. ?Eat foods that are high in fiber. These include beans, whole grains, and fresh fruits and vegetables. ?Limit foods that are high in fat and sugar. These include fried or sweet foods. ?Lifestyle ?Do not drink alcohol. ?Do not use any products that contain nicotine or tobacco, such as cigarettes, e-cigarettes, and chewing tobacco. If you need help quitting, ask your doctor. ?Get at least 8 hours of sleep every night. ?Limit and deal with stress. ?General instructions ? ?  ? ?Keep a journal to find out what may bring on your migraine headaches. For example, write down: ?What you eat and drink. ?How much sleep you get. ?Any change in what you eat or drink. ?Any change in your medicines. ?If you have a migraine headache: ?Avoid things that make your symptoms worse, such as bright lights. ?It may help to lie down in a dark, quiet room. ?Do not drive or use heavy machinery. ?Ask your doctor what activities are safe for you. ?Keep all follow-up visits as told by your doctor. This is important. ?Contact a doctor if: ?You get a migraine   headache that is different or worse than others you have had. ?You have more than 15 headache days in one  month. ?Get help right away if: ?Your migraine headache gets very bad. ?Your migraine headache lasts longer than 72 hours. ?You have a fever. ?You have a stiff neck. ?You have trouble seeing. ?Your muscles feel weak or like you cannot control them. ?You start to lose your balance a lot. ?You start to have trouble walking. ?You pass out (faint). ?You have a seizure. ?Summary ?A migraine headache is a very strong throbbing pain on one side or both sides of your head. These headaches can also cause other symptoms. ?This condition may be treated with medicines and changes to your lifestyle. ?Keep a journal to find out what may bring on your migraine headaches. ?Contact a doctor if you get a migraine headache that is different or worse than others you have had. ?Contact your doctor if you have more than 15 headache days in a month. ?This information is not intended to replace advice given to you by your health care provider. Make sure you discuss any questions you have with your health care provider. ?Document Revised: 10/06/2018 Document Reviewed: 07/27/2018 ?Elsevier Patient Education ? 2023 [employer] Inc[employer] ? ?

## 2022-03-17 NOTE — Progress Notes (Signed)
There were no vitals taken for this visit.   Subjective:    Patient ID: Tiffany Odom, female    DOB: 15-Sep-1975, 46 y.o.   MRN: [id number]  HPI: Tiffany Odom is a 46 y.o. female  Chief Complaint  Patient presents with   Migraine    Patient says the past almost month now, she is having this really bad migraines. Patient says she has been taking Excedrin and Tylenol and it is not really helping. Patient says she has been waking up with these migraines and lasted most of the day. Patient says she was taking Ibuprofen in the past for her migraines and it would help, but she was told to stop taking Ibuprofen due to her Hypertension.    This visit was completed via video visit through MyChart due to the restrictions of the COVID-19 pandemic. All issues as above were discussed and addressed. Physical exam was done as above through visual confirmation on video through MyChart. If it was felt that the patient should be evaluated in the office, they were directed there. The patient verbally consented to this visit. Location of the patient: home Location of the provider: work Those involved with this call:  Provider: Marnee Guarneri, DNP CMA: Irena Reichmann, [place] Desk/Registration: [employer]  Time spent on call:  21 minutes with patient face to face via video conference. More than 50% of this time was spent in counseling and coordination of care. 15 minutes total spent in review of patient's record and preparation of their chart.  I verified patient identity using two factors (patient name and date of birth). Patient consents verbally to being seen via telemedicine visit today.    MIGRAINES Fighting with migraines for over a month -- used to take migraine medication 10 years ago -- does not recall what she took.  Does have underlying diabetes and HTN, on medications.  Over past month or so the migraines have become worse. Duration: chronic Onset: gradual Severity: 10/10 at worst, today  is a 5/10 Quality: sharp, dull, and aching Frequency: intermittent Location: temples both sides Headache duration: 24 or more hours Radiation: no Time of day headache occurs: varies Alleviating factors: Tylenol  Aggravating factors: lights Headache status at time of visit: current headache Treatments attempted: rest, heat, and APAP   Aura: yes Nausea:  no Vomiting: no Photophobia:  yes Phonophobia:  no Effect on social functioning:  yes Numbers of missed days of school/work each month: 0 Confusion:  no Gait disturbance/ataxia:  no Behavioral changes:  no Fevers:  no   Relevant past medical, surgical, family and social history reviewed and updated as indicated. Interim medical history since our last visit reviewed. Allergies and medications reviewed and updated.  Review of Systems  Constitutional:  Negative for activity change, appetite change, diaphoresis, fatigue and fever.  Respiratory:  Negative for cough, chest tightness and shortness of breath.   Cardiovascular:  Negative for chest pain, palpitations and leg swelling.  Gastrointestinal: Negative.   Endocrine: Negative for cold intolerance, heat intolerance, polydipsia, polyphagia and polyuria.  Neurological:  Positive for headaches. Negative for dizziness, syncope, weakness and numbness.  Psychiatric/Behavioral: Negative.      Per HPI unless specifically indicated above     Objective:    There were no vitals taken for this visit.  Wt Readings from Last 3 Encounters:  02/17/22 188 lb 3.2 oz (85.4 kg)  11/26/21 185 lb 12.8 oz (84.3 kg)  10/23/21 189 lb 6.4 oz (85.9 kg)  Physical Exam Vitals and nursing note reviewed.  Constitutional:      General: She is awake. She is not in acute distress.    Appearance: She is well-developed. She is not ill-appearing.  HENT:     Head: Normocephalic.     Right Ear: Hearing normal.     Left Ear: Hearing normal.  Eyes:     General: Lids are normal.        Right eye: No  discharge.        Left eye: No discharge.     Conjunctiva/sclera: Conjunctivae normal.  Pulmonary:     Effort: Pulmonary effort is normal. No accessory muscle usage or respiratory distress.  Musculoskeletal:     Cervical back: Normal range of motion.  Neurological:     Mental Status: She is alert and oriented to person, place, and time.  Psychiatric:        Attention and Perception: Attention normal.        Mood and Affect: Mood normal.        Behavior: Behavior normal. Behavior is cooperative.        Thought Content: Thought content normal.        Judgment: Judgment normal.     Results for orders placed or performed in visit on 02/17/22  Bayer DCA Hb A1c Waived  Result Value Ref Range   HB A1C (BAYER DCA - WAIVED) 6.4 (H) 4.8 - 5.6 %  Comprehensive metabolic panel  Result Value Ref Range   Glucose 117 (H) 70 - 99 mg/dL   BUN 10 6 - 24 mg/dL   Creatinine, Ser 0.72 0.57 - 1.00 mg/dL   eGFR 105 >59 mL/min/1.73   BUN/Creatinine Ratio 14 9 - 23   Sodium 139 134 - 144 mmol/L   Potassium 4.4 3.5 - 5.2 mmol/L   Chloride 101 96 - 106 mmol/L   CO2 24 20 - 29 mmol/L   Calcium 9.7 8.7 - 10.2 mg/dL   Total Protein 6.8 6.0 - 8.5 g/dL   Albumin 4.3 3.9 - 4.9 g/dL   Globulin, Total 2.5 1.5 - 4.5 g/dL   Albumin/Globulin Ratio 1.7 1.2 - 2.2   Bilirubin Total 0.6 0.0 - 1.2 mg/dL   Alkaline Phosphatase 76 44 - 121 IU/L   AST 18 0 - 40 IU/L   ALT 20 0 - 32 IU/L  Lipid Panel w/o Chol/HDL Ratio  Result Value Ref Range   Cholesterol, Total 221 (H) 100 - 199 mg/dL   Triglycerides 146 0 - 149 mg/dL   HDL 37 (L) >39 mg/dL   VLDL Cholesterol Cal 27 5 - 40 mg/dL   LDL Chol Calc (NIH) 157 (H) 0 - 99 mg/dL  VITAMIN D 25 Hydroxy (Vit-D Deficiency, Fractures)  Result Value Ref Range   Vit D, 25-Hydroxy 33.0 30.0 - 100.0 ng/mL      Assessment & Plan:   Problem List Items Addressed This Visit       Cardiovascular and Mediastinum   Migraine    Chronic, with worsening at present due to new  lighting at her work and stressors.  Will start Amitriptyline 10 MG at night which may help as preventative and help with sleep, educated her on this medication and use (monitor with her Prozac, may need to change to alternate).  Would prefer to avoid triptans due to her HTN - trial Nurtec as needed 75 MG daily -- may consider in future stopping Amitriptyline and going to daily Nurtec as preventative if tolerates this  and benefit presents.  Educated patient at length on these medications and use.  No red flag symptoms present.  Continue Tylenol as needed.  Return in 4 weeks.      Relevant Medications   Rimegepant Sulfate (NURTEC) 75 MG TBDP   amitriptyline (ELAVIL) 10 MG tablet    I discussed the assessment and treatment plan with the patient. The patient was provided an opportunity to ask questions and all were answered. The patient agreed with the plan and demonstrated an understanding of the instructions.   The patient was advised to call back or seek an in-person evaluation if the symptoms worsen or if the condition fails to improve as anticipated.   I provided 21+ minutes of time during this encounter.    Follow up plan: Return in about 4 weeks (around 04/14/2022) for Migraines.

## 2022-03-17 NOTE — Assessment & Plan Note (Signed)
Chronic, with worsening at present due to new lighting at her work and stressors.  Will start Amitriptyline 10 MG at night which may help as preventative and help with sleep, educated her on this medication and use (monitor with her Prozac, may need to change to alternate).  Would prefer to avoid triptans due to her HTN - trial Nurtec as needed 75 MG daily -- may consider in future stopping Amitriptyline and going to daily Nurtec as preventative if tolerates this and benefit presents.  Educated patient at length on these medications and use.  No red flag symptoms present.  Continue Tylenol as needed.  Return in 4 weeks.

## 2022-03-17 NOTE — Telephone Encounter (Signed)
Patient notified of Tiffany Odom's recommendations via [place].

## 2022-03-17 NOTE — Addendum Note (Signed)
Addended by: Marnee Guarneri T on: 03/17/2022 01:38 PM   Modules accepted: Orders

## 2022-03-17 NOTE — Telephone Encounter (Signed)
Prior authorization was initiated via CoverMyMeds for Nurtec 75 MG tablets. Awaiting patient's insurance for determination.  KEY: [id number]

## 2022-03-24 ENCOUNTER — Encounter: Payer: Self-pay | Admitting: Nurse Practitioner

## 2022-03-24 MED ORDER — GLUCOSE BLOOD VI STRP: ORAL_STRIP | 12 refills | Status: DC

## 2022-03-24 MED ORDER — FREESTYLE LANCETS MISC: 12 refills | Status: DC

## 2022-03-31 ENCOUNTER — Other Ambulatory Visit: Payer: Self-pay | Admitting: Nurse Practitioner

## 2022-03-31 MED ORDER — NURTEC 75 MG PO TBDP: 75.0000 mg | ORAL_TABLET | Freq: Every day | ORAL | 12 refills | Status: DC | PRN

## 2022-04-08 ENCOUNTER — Other Ambulatory Visit: Payer: Self-pay | Admitting: Nurse Practitioner

## 2022-04-08 NOTE — Telephone Encounter (Signed)
Requested Prescriptions  Pending Prescriptions Disp Refills  . amitriptyline (ELAVIL) 10 MG tablet [Pharmacy Med Name: AMITRIPTYLINE HCL 10 MG TAB] 90 tablet 1    Sig: TAKE 1 TABLET BY MOUTH EVERYDAY AT BEDTIME     Psychiatry:  Antidepressants - Heterocyclics (TCAs) Passed - 04/08/2022 11:33 AM      Passed - Completed PHQ-2 or PHQ-9 in the last 360 days      Passed - Valid encounter within last 6 months    Recent Outpatient Visits          3 weeks ago Migraine with aura and without status migrainosus, not intractable   [place], Jolene T, NP   1 month ago Type 2 diabetes mellitus with morbid obesity ([place])   [place], [place] T, NP   5 months ago Syncope, unspecified syncope type   [facility], Megan P, DO   8 months ago Type 2 diabetes mellitus with morbid obesity ([place])   [place] Cannady, Jolene T, NP   11 months ago Cough, persistent   [place], Barbaraann Faster, NP      Future Appointments            In 4 months Cannady, Barbaraann Faster, NP [facility]

## 2022-04-13 ENCOUNTER — Other Ambulatory Visit: Payer: Self-pay | Admitting: Nurse Practitioner

## 2022-04-13 NOTE — Telephone Encounter (Signed)
Requested medication (s) are due for refill today:   Yes  Requested medication (s) are on the active medication list:   Yes  Future visit scheduled:   Yes for 08/25/2022  Tiffany had requested a 4 week f/u in her notes.   No visit noted.   Last ordered: 03/17/2022 #30, 0 refills  Returned  due to missed 4 week f/u appt.     Requested Prescriptions  Pending Prescriptions Disp Refills   amitriptyline (ELAVIL) 10 MG tablet [Pharmacy Med Name: AMITRIPTYLINE HCL 10 MG TAB] 30 tablet 0    Sig: TAKE 1 TABLET BY MOUTH EVERYDAY AT BEDTIME     Psychiatry:  Antidepressants - Heterocyclics (TCAs) Passed - 04/13/2022  1:28 AM      Passed - Completed PHQ-2 or PHQ-9 in the last 360 days      Passed - Valid encounter within last 6 months    Recent Outpatient Visits           3 weeks ago Migraine with aura and without status migrainosus, not intractable   [place], Tiffany T, NP   1 month ago Type 2 diabetes mellitus with morbid obesity ([place])   [place], [place] T, NP   5 months ago Syncope, unspecified syncope type   [facility], Tiffany P, DO   8 months ago Type 2 diabetes mellitus with morbid obesity ([place])   [place] Cannady, Tiffany T, NP   11 months ago Cough, persistent   [place], Tiffany Faster, NP       Future Appointments             In 4 months Cannady, Tiffany Faster, NP [facility]

## 2022-04-14 ENCOUNTER — Encounter: Payer: Self-pay | Admitting: Emergency Medicine

## 2022-04-14 ENCOUNTER — Encounter: Payer: Self-pay | Admitting: Nurse Practitioner

## 2022-04-14 ENCOUNTER — Other Ambulatory Visit: Payer: Self-pay

## 2022-04-14 ENCOUNTER — Emergency Department: Payer: BC Managed Care – PPO

## 2022-04-14 DIAGNOSIS — R42 Dizziness and giddiness: Secondary | ICD-10-CM | POA: Diagnosis not present

## 2022-04-14 DIAGNOSIS — R509 Fever, unspecified: Secondary | ICD-10-CM | POA: Diagnosis not present

## 2022-04-14 DIAGNOSIS — I10 Essential (primary) hypertension: Secondary | ICD-10-CM | POA: Insufficient documentation

## 2022-04-14 DIAGNOSIS — E119 Type 2 diabetes mellitus without complications: Secondary | ICD-10-CM | POA: Diagnosis not present

## 2022-04-14 DIAGNOSIS — G43009 Migraine without aura, not intractable, without status migrainosus: Secondary | ICD-10-CM | POA: Insufficient documentation

## 2022-04-14 DIAGNOSIS — R519 Headache, unspecified: Secondary | ICD-10-CM | POA: Diagnosis not present

## 2022-04-14 DIAGNOSIS — Z79899 Other long term (current) drug therapy: Secondary | ICD-10-CM | POA: Diagnosis not present

## 2022-04-14 LAB — CBC
HCT: 42.6 % (ref 36.0–46.0)
Hemoglobin: 14.2 g/dL (ref 12.0–15.0)
MCH: 30.3 pg (ref 26.0–34.0)
MCHC: 33.3 g/dL (ref 30.0–36.0)
MCV: 90.8 fL (ref 80.0–100.0)
Platelets: 396 10*3/uL (ref 150–400)
RBC: 4.69 MIL/uL (ref 3.87–5.11)
RDW: 11.8 % (ref 11.5–15.5)
WBC: 17.8 10*3/uL — ABNORMAL HIGH (ref 4.0–10.5)
nRBC: 0 % (ref 0.0–0.2)

## 2022-04-14 LAB — BASIC METABOLIC PANEL
Anion gap: 12 (ref 5–15)
BUN: 23 mg/dL — ABNORMAL HIGH (ref 6–20)
CO2: 26 mmol/L (ref 22–32)
Calcium: 9.9 mg/dL (ref 8.9–10.3)
Chloride: 98 mmol/L (ref 98–111)
Creatinine, Ser: 0.93 mg/dL (ref 0.44–1.00)
GFR, Estimated: >60 mL/min (ref >60)
Glucose, Bld: 197 mg/dL — ABNORMAL HIGH (ref 70–99)
Potassium: 3.6 mmol/L (ref 3.5–5.1)
Sodium: 136 mmol/L (ref 135–145)

## 2022-04-14 LAB — TROPONIN I (HIGH SENSITIVITY)
Troponin I (High Sensitivity): 4 ng/L (ref <18)
Troponin I (High Sensitivity): 3 ng/L (ref <18)

## 2022-04-14 MED ORDER — ONDANSETRON HCL 4 MG/2ML IJ SOLN: 4.0000 mg | Freq: Once | INTRAMUSCULAR | Status: DC

## 2022-04-14 MED ORDER — ONDANSETRON 4 MG PO TBDP
4.0000 mg | ORAL_TABLET | Freq: Once | ORAL | Status: AC
  Administered 2022-04-14: 4 mg via ORAL
  Filled 2022-04-14: qty 1

## 2022-04-14 NOTE — ED Triage Notes (Signed)
Pt presents via EMS from home with complaints of HTN with associated headache, dizziness, nausea, and CP. Pt states that her sx started this AM and have progressively gotten worse over the course of the day. She notes being compliant with her BP medication and took a dose today.  Denies AP or SOB.

## 2022-04-14 NOTE — ED Triage Notes (Signed)
First Nurse Note;  Pt via EMS from home. Pt c/o dizziness, NV, and HTN. EMS gave 4mg  of Zofran   187/98 120 CBG 100% on RA 90 HR

## 2022-04-15 ENCOUNTER — Emergency Department
Admission: EM | Admit: 2022-04-15 | Discharge: 2022-04-15 | Disposition: A | Discharge status: Home / Self Care | Payer: BC Managed Care – PPO | Attending: Emergency Medicine | Admitting: Emergency Medicine

## 2022-04-15 ENCOUNTER — Emergency Department: Payer: BC Managed Care – PPO

## 2022-04-15 DIAGNOSIS — R519 Headache, unspecified: Secondary | ICD-10-CM | POA: Diagnosis not present

## 2022-04-15 DIAGNOSIS — I10 Essential (primary) hypertension: Secondary | ICD-10-CM

## 2022-04-15 DIAGNOSIS — G43009 Migraine without aura, not intractable, without status migrainosus: Secondary | ICD-10-CM

## 2022-04-15 LAB — URINALYSIS, ROUTINE W REFLEX MICROSCOPIC
Bilirubin Urine: NEGATIVE
Glucose, UA: NEGATIVE mg/dL
Hgb urine dipstick: NEGATIVE
Ketones, ur: 5 mg/dL — AB
Nitrite: NEGATIVE
Protein, ur: 30 mg/dL — AB
Specific Gravity, Urine: 1.02 (ref 1.005–1.030)
pH: 7 (ref 5.0–8.0)

## 2022-04-15 MED ORDER — METOCLOPRAMIDE HCL 5 MG/ML IJ SOLN
10.0000 mg | Freq: Once | INTRAMUSCULAR | Status: AC
  Administered 2022-04-15: 10 mg via INTRAVENOUS
  Filled 2022-04-15: qty 2

## 2022-04-15 MED ORDER — SODIUM CHLORIDE 0.9 % IV BOLUS (SEPSIS)
1000.0000 mL | Freq: Once | INTRAVENOUS | Status: AC
  Administered 2022-04-15: 1000 mL via INTRAVENOUS

## 2022-04-15 MED ORDER — DROPERIDOL 2.5 MG/ML IJ SOLN
2.5000 mg | Freq: Once | INTRAMUSCULAR | Status: AC
  Administered 2022-04-15: 2.5 mg via INTRAVENOUS
  Filled 2022-04-15: qty 2

## 2022-04-15 MED ORDER — KETOROLAC TROMETHAMINE 30 MG/ML IJ SOLN
30.0000 mg | Freq: Once | INTRAMUSCULAR | Status: AC
  Administered 2022-04-15: 30 mg via INTRAVENOUS
  Filled 2022-04-15: qty 1

## 2022-04-15 NOTE — ED Provider Notes (Signed)
[facility] Provider Note    Event Date/Time   First MD Initiated Contact with Patient 04/15/22 0158     (approximate)   History   Hypertension   HPI  Tiffany Odom is a 46 y.o. female with history of hypertension on irbesartan, diabetes, hyperlipidemia, migraine headaches who presents to the emergency department complaints of a headache tonight that started at work.  States she checked her Nurtec without much relief.  She had lightheadedness and vomiting.  Headache is worse with lights and sound but she states it feels different than her migraines because it is "affecting me all over".  She states that she got home from work and checked her blood pressure and was 174/102.  She states that she is taking her blood pressure medication today but sometimes misses doses.  No head injury.  Not on blood thinners.  No numbness, tingling or weakness.  No chest pain or shortness of breath currently but states she did have some chest pain on arrival.  No vision changes.   History provided by patient.    Past Medical History:  Diagnosis Date   Depression    Diabetes ([place])    Hyperlipemia    Hypertension     Past Surgical History:  Procedure Laterality Date   ABDOMINAL HYSTERECTOMY     BILATERAL CARPAL TUNNEL RELEASE     CESAREAN SECTION     CHOLECYSTECTOMY      MEDICATIONS:  Prior to Admission medications   Medication Sig Start Date End Date Taking? Authorizing Provider  amitriptyline (ELAVIL) 10 MG tablet TAKE 1 TABLET BY MOUTH EVERYDAY AT BEDTIME 04/13/22   Cannady, Jolene T, NP  atorvastatin (LIPITOR) 80 MG tablet Take 1 tablet (80 mg total) by mouth daily. 09/30/21   Marnee Guarneri T, NP  Blood Glucose Monitoring Suppl (ONETOUCH VERIO) w/Device KIT Use 1-4 times daily as needed/directed DX E11.9 09/30/21   Cannady, Henrine Screws T, NP  ezetimibe (ZETIA) 10 MG tablet Take 1 tablet (10 mg total) by mouth daily. 02/18/22   Cannady, Henrine Screws T, NP  FLUoxetine (PROZAC)  40 MG capsule Take 1 capsule (40 mg total) by mouth daily. 10/29/21   Cannady, Henrine Screws T, NP  glucose blood test strip Use to check blood sugar 1-2 times daily. 03/24/22   Cannady, Henrine Screws T, NP  irbesartan (AVAPRO) 150 MG tablet Take 1 tablet (150 mg total) by mouth daily. 02/17/22   Cannady, Henrine Screws T, NP  Lancets (FREESTYLE) lancets Use to check blood sugar 1-2 times daily. 03/24/22   Cannady, Henrine Screws T, NP  Rimegepant Sulfate (NURTEC) 75 MG TBDP Take 75 mg by mouth daily as needed. 03/31/22   Venita Lick, NP    Physical Exam   Triage Vital Signs: ED Triage Vitals  Enc Vitals Group     BP 04/14/22 1911 (!) 220/100     Pulse Rate 04/14/22 1911 90     Resp 04/14/22 1911 18     Temp 04/14/22 1911 98.1 F (36.7 C)     Temp Source 04/14/22 1911 Oral     SpO2 04/14/22 1911 96 %     Weight 04/14/22 1909 190 lb (86.2 kg)     Height 04/14/22 1909 '5\' 1"'  (1.549 m)     Head Circumference --      Peak Flow --      Pain Score 04/14/22 1909 10     Pain Loc --      Pain Edu? --  Excl. in [place]? --     Most recent vital signs: Vitals:   04/15/22 0055 04/15/22 0321  BP: (!) 160/94 123/62  Pulse: 85 72  Resp: 17 18  Temp: 98.1 F (36.7 C) 97.6 F (36.4 C)  SpO2: 90% 96%    CONSTITUTIONAL: Alert and oriented and responds appropriately to questions.  Appears slightly uncomfortable but not in distress HEAD: Normocephalic, atraumatic EYES: Conjunctivae clear, pupils appear equal, sclera nonicteric; + photophobia ENT: normal nose; moist mucous membranes NECK: Supple, normal ROM CARD: RRR; S1 and S2 appreciated; no murmurs, no clicks, no rubs, no gallops RESP: Normal chest excursion without splinting or tachypnea; breath sounds clear and equal bilaterally; no wheezes, no rhonchi, no rales, no hypoxia or respiratory distress, speaking full sentences ABD/GI: Normal bowel sounds; non-distended; soft, non-tender, no rebound, no guarding, no peritoneal signs BACK: The back appears normal EXT:  Normal ROM in all joints; no deformity noted, no edema; no cyanosis SKIN: Normal color for age and race; warm; no rash on exposed skin NEURO: Moves all extremities equally, normal speech, normal sensation, normal gait, cranial nerves II through XII intact PSYCH: The patient's mood and manner are appropriate.   ED Results / Procedures / Treatments   LABS: (all labs ordered are listed, but only abnormal results are displayed) Labs Reviewed  BASIC METABOLIC PANEL - Abnormal; Notable for the following components:      Result Value   Glucose, Bld 197 (*)    BUN 23 (*)    All other components within normal limits  CBC - Abnormal; Notable for the following components:   WBC 17.8 (*)    All other components within normal limits  URINALYSIS, ROUTINE W REFLEX MICROSCOPIC - Abnormal; Notable for the following components:   Color, Urine YELLOW (*)    APPearance HAZY (*)    Ketones, ur 5 (*)    Protein, ur 30 (*)    Leukocytes,Ua SMALL (*)    Bacteria, UA RARE (*)    All other components within normal limits  TROPONIN I (HIGH SENSITIVITY)  TROPONIN I (HIGH SENSITIVITY)     EKG:  EKG Interpretation  Date/Time:  Wednesday April 14 2022 19:13:00 EDT Ventricular Rate:  87 PR Interval:  148 QRS Duration: 78 QT Interval:  394 QTC Calculation: 474 R Axis:   79 Text Interpretation: Normal sinus rhythm Normal ECG No previous ECGs available Confirmed by Pryor Curia [phone]) on 04/15/2022 3:03:38 AM         RADIOLOGY: My personal review and interpretation of imaging: CT head negative.  Chest x-ray clear.  I have personally reviewed all radiology reports.   CT HEAD WO CONTRAST (5MM)  Result Date: 04/15/2022 CLINICAL DATA:  Headache, sudden, severe EXAM: CT HEAD WITHOUT CONTRAST TECHNIQUE: Contiguous axial images were obtained from the base of the skull through the vertex without intravenous contrast. RADIATION DOSE REDUCTION: This exam was performed according to the departmental  dose-optimization program which includes automated exposure control, adjustment of the mA and/or kV according to patient size and/or use of iterative reconstruction technique. COMPARISON:  10/23/2021 FINDINGS: Brain: No acute intracranial abnormality. Specifically, no hemorrhage, hydrocephalus, mass lesion, acute infarction, or significant intracranial injury. Vascular: No hyperdense vessel or unexpected calcification. Skull: No acute calvarial abnormality. Sinuses/Orbits: No acute findings Other: None IMPRESSION: Normal study. Electronically Signed   By: Rolm Baptise M.D.   On: 04/15/2022 02:53   DG Chest 2 View  Result Date: 04/14/2022 CLINICAL DATA:  Dizziness, fever EXAM: CHEST - 2  VIEW COMPARISON:  05/04/2021 FINDINGS: Cardiac size is within normal limits. There is poor inspiration. There are no signs of pulmonary edema or focal pulmonary consolidation. There is no pleural effusion or pneumothorax. IMPRESSION: No active cardiopulmonary disease. Electronically Signed   By: Elmer Picker M.D.   On: 04/14/2022 19:36     PROCEDURES:  Critical Care performed: No     Procedures    IMPRESSION / MDM / ASSESSMENT AND PLAN / ED COURSE  I reviewed the triage vital signs and the nursing notes.    Patient here with chest pain, headache, hypertension.  The patient is on the cardiac monitor to evaluate for evidence of arrhythmia and/or significant heart rate changes.   DIFFERENTIAL DIAGNOSIS (includes but not limited to):   Hypertensive urgency/emergency, intracranial hemorrhage, migraine headache, doubt CVA, CVT, meningitis   Patient's presentation is most consistent with acute presentation with potential threat to life or bodily function.   PLAN: Work-up initiated in triage.  Patient does have a leukocytosis of 17,000 but denies fevers, cough, diet area, urinary symptoms.  No meningismus here.  This could be reactive.  Normal renal function, electrolytes.  Troponin x2 negative.   Chest x-ray reviewed and interpreted by myself and the radiologist and shows no acute abnormality.  EKG nonischemic.  Will obtain CT of the head given she states this feels different than her normal migraines and a urinalysis today.  Her blood pressure had already improved on arrival to the ED and I wonder if pain is what is causing her blood pressure rise rather than blood pressure causing the headache.  Will give migraine cocktail.   MEDICATIONS GIVEN IN ED: Medications  ondansetron (ZOFRAN-ODT) disintegrating tablet 4 mg (4 mg Oral Given 04/14/22 1941)  ketorolac (TORADOL) 30 MG/ML injection 30 mg (30 mg Intravenous Given 04/15/22 0219)  metoCLOPramide (REGLAN) injection 10 mg (10 mg Intravenous Given 04/15/22 0219)  sodium chloride 0.9 % bolus 1,000 mL (0 mLs Intravenous Stopped 04/15/22 0247)  droperidol (INAPSINE) 2.5 MG/ML injection 2.5 mg (2.5 mg Intravenous Given 04/15/22 0328)     ED COURSE: CT head reviewed and interpreted by myself and the radiologist and shows no acute abnormality.  Patient reports headache has improved but not resolved.  Requesting medication.  Will give droperidol.  Urine shows no signs of infection.  She has have some proteinuria.  Her blood pressure is now down to 123/62.   4:30 AM  Pt reports headache is now gone and she feels comfortable plan for discharge home.   At this time, I do not feel there is any life-threatening condition present. I reviewed all nursing notes, vitals, pertinent previous records.  All lab and urine results, EKGs, imaging ordered have been independently reviewed and interpreted by myself.  I reviewed all available radiology reports from any imaging ordered this visit.  Based on my assessment, I feel the patient is safe to be discharged home without further emergent workup and can continue workup as an outpatient as needed. Discussed all findings, treatment plan as well as usual and customary return precautions.  They verbalize  understanding and are comfortable with this plan.  Outpatient follow-up has been provided as needed.  All questions have been answered.   CONSULTS:  none   OUTSIDE RECORDS REVIEWED: Reviewed patient's last family medicine note on 11/05/2020.       FINAL CLINICAL IMPRESSION(S) / ED DIAGNOSES   Final diagnoses:  Uncontrolled hypertension  Migraine without aura and without status migrainosus, not intractable  Rx / DC Orders   ED Discharge Orders     None        Note:  This document was prepared using Dragon voice recognition software and may include unintentional dictation errors.   Antonina Deziel, Delice Bison, DO 04/15/22 [phone]

## 2022-04-15 NOTE — Discharge Instructions (Addendum)
Please continue irbesartan as prescribed and follow-up closely with your primary care provider.

## 2022-04-16 ENCOUNTER — Telehealth: Payer: Self-pay

## 2022-04-16 ENCOUNTER — Ambulatory Visit: Payer: Self-pay | Admitting: *Deleted

## 2022-04-16 ENCOUNTER — Ambulatory Visit: Payer: BC Managed Care – PPO | Admitting: Nurse Practitioner

## 2022-04-16 DIAGNOSIS — F32A Depression, unspecified: Secondary | ICD-10-CM | POA: Diagnosis not present

## 2022-04-16 DIAGNOSIS — R079 Chest pain, unspecified: Secondary | ICD-10-CM | POA: Diagnosis not present

## 2022-04-16 DIAGNOSIS — Z1152 Encounter for screening for COVID-19: Secondary | ICD-10-CM | POA: Diagnosis not present

## 2022-04-16 DIAGNOSIS — R110 Nausea: Secondary | ICD-10-CM | POA: Diagnosis not present

## 2022-04-16 DIAGNOSIS — R519 Headache, unspecified: Secondary | ICD-10-CM | POA: Diagnosis not present

## 2022-04-16 DIAGNOSIS — Z20822 Contact with and (suspected) exposure to covid-19: Secondary | ICD-10-CM | POA: Diagnosis not present

## 2022-04-16 DIAGNOSIS — R42 Dizziness and giddiness: Secondary | ICD-10-CM | POA: Diagnosis not present

## 2022-04-16 DIAGNOSIS — E785 Hyperlipidemia, unspecified: Secondary | ICD-10-CM | POA: Diagnosis not present

## 2022-04-16 DIAGNOSIS — I10 Essential (primary) hypertension: Secondary | ICD-10-CM | POA: Diagnosis not present

## 2022-04-16 NOTE — Telephone Encounter (Signed)
Called and spoke with patient. She states that she went to [facility] his morning for evaluation, was given medications to take this weekend. Has a follow up appointment with [facility] 04/20/22.

## 2022-04-16 NOTE — Telephone Encounter (Signed)
Transition Care Management Follow-up Telephone Call Date of discharge and from where: 04/15/22 from [facility] ED, 04/16/22 from [facility]  How have you been since you were released from the hospital? "Tiffany Odom been better" Any questions or concerns? No  Items Reviewed: Did the pt receive and understand the discharge instructions provided? Yes  Medications obtained and verified? Yes  Other? No  Any new allergies since your discharge? No  Dietary orders reviewed? Yes Do you have support at home? Yes   Home Care and Equipment/Supplies: Were home health services ordered? not applicable If so, what is the name of the agency? N/A  Has the agency set up a time to come to the patient's home? not applicable Were any new equipment or medical supplies ordered?  No What is the name of the medical supply agency? N/A Were you able to get the supplies/equipment? not applicable Do you have any questions related to the use of the equipment or supplies? No  Functional Questionnaire: (I = Independent and D = Dependent) ADLs: I  Bathing/Dressing- I  Meal Prep- I  Eating- I  Maintaining continence- I  Transferring/Ambulation- I  Managing Meds- I  Follow up appointments reviewed:  PCP Hospital f/u appt confirmed? Yes  Scheduled to see Tiffany Guarneri, DNP on 04/20/22 @ 10:20 AM. [name] f/u appt confirmed? No   Are transportation arrangements needed? No  If their condition worsens, is the pt aware to call PCP or go to the Emergency Dept.? Yes Was the patient provided with contact information for the PCP's office or ED? Yes Was to pt encouraged to call back with questions or concerns? Yes

## 2022-04-16 NOTE — Telephone Encounter (Signed)
Reason for Disposition . [2] Systolic BP  >= 671 OR Diastolic >= 245 AND [8] cardiac (e.g., breathing difficulty, chest pain) or neurologic symptoms (e.g., new-onset blurred or double vision, unsteady gait)  Answer Assessment - Initial Assessment Questions 1. BLOOD PRESSURE: "What is the blood pressure?" "Did you take at least two measurements 5 minutes apart?"     184/100, 186/113 2. ONSET: "When did you take your blood pressure?"     8:00, 8:10 3. HOW: "How did you take your blood pressure?" (e.g., automatic home BP monitor, visiting nurse)     Wrist cuff 4. HISTORY: "Do you have a history of high blood pressure?"     yes 5. MEDICINES: "Are you taking any medicines for blood pressure?" "Have you missed any doses recently?"     Only one day this week 6. OTHER SYMPTOMS: "Do you have any symptoms?" (e.g., blurred vision, chest pain, difficulty breathing, headache, weakness)     Light headed, dizzy, chest  7. PREGNANCY: "Is there any chance you are pregnant?" "When was your last menstrual period?"  Protocols used: Blood Pressure - High-A-AH

## 2022-04-16 NOTE — Telephone Encounter (Addendum)
  Chief Complaint: elevated BP Symptoms: dizziness, lightheaded,chest pain Frequency: today  Pertinent Negatives: Patient denies   Disposition: [x] ED /[] Urgent Care (no appt availability in office) / [] Appointment(In office/virtual)/ []  Early Virtual Care/ [] Home Care/ [] Refused Recommended Disposition /[]  Mobile Bus/ []  Follow-up with PCP Additional Notes: Advised ED- no one to drive- advised [facility] for transport

## 2022-04-18 NOTE — Patient Instructions (Signed)
Migraine Headache ?A migraine headache is a very strong throbbing pain on one side or both sides of your head. This type of headache can also cause other symptoms. It can last from 4 hours to 3 days. Talk with your doctor about what things may bring on (trigger) this condition. ?What are the causes? ?The exact cause of this condition is not known. This condition may be triggered or caused by: ?Drinking alcohol. ?Smoking. ?Taking medicines, such as: ?Medicine used to treat chest pain (nitroglycerin). ?Birth control pills. ?Estrogen. ?Some blood pressure medicines. ?Eating or drinking certain products. ?Doing physical activity. ?Other things that may trigger a migraine headache include: ?Having a menstrual period. ?Pregnancy. ?Hunger. ?Stress. ?Not getting enough sleep or getting too much sleep. ?Weather changes. ?Tiredness (fatigue). ?What increases the risk? ?Being 25-55 years old. ?Being female. ?Having a family history of migraine headaches. ?Being Caucasian. ?Having depression or anxiety. ?Being very overweight. ?What are the signs or symptoms? ?A throbbing pain. This pain may: ?Happen in any area of the head, such as on one side or both sides. ?Make it hard to do daily activities. ?Get worse with physical activity. ?Get worse around bright lights or loud noises. ?Other symptoms may include: ?Feeling sick to your stomach (nauseous). ?Vomiting. ?Dizziness. ?Being sensitive to bright lights, loud noises, or smells. ?Before you get a migraine headache, you may get warning signs (an aura). An aura may include: ?Seeing flashing lights or having blind spots. ?Seeing bright spots, halos, or zigzag lines. ?Having tunnel vision or blurred vision. ?Having numbness or a tingling feeling. ?Having trouble talking. ?Having weak muscles. ?Some people have symptoms after a migraine headache (postdromal phase), such as: ?Tiredness. ?Trouble thinking (concentrating). ?How is this treated? ?Taking medicines that: ?Relieve  pain. ?Relieve the feeling of being sick to your stomach. ?Prevent migraine headaches. ?Treatment may also include: ?Having acupuncture. ?Avoiding foods that bring on migraine headaches. ?Learning ways to control your body functions (biofeedback). ?Therapy to help you know and deal with negative thoughts (cognitive behavioral therapy). ?Follow these instructions at home: ?Medicines ?Take over-the-counter and prescription medicines only as told by your doctor. ?Ask your doctor if the medicine prescribed to you: ?Requires you to avoid driving or using heavy machinery. ?Can cause trouble pooping (constipation). You may need to take these steps to prevent or treat trouble pooping: ?Drink enough fluid to keep your pee (urine) pale yellow. ?Take over-the-counter or prescription medicines. ?Eat foods that are high in fiber. These include beans, whole grains, and fresh fruits and vegetables. ?Limit foods that are high in fat and sugar. These include fried or sweet foods. ?Lifestyle ?Do not drink alcohol. ?Do not use any products that contain nicotine or tobacco, such as cigarettes, e-cigarettes, and chewing tobacco. If you need help quitting, ask your doctor. ?Get at least 8 hours of sleep every night. ?Limit and deal with stress. ?General instructions ? ?  ? ?Keep a journal to find out what may bring on your migraine headaches. For example, write down: ?What you eat and drink. ?How much sleep you get. ?Any change in what you eat or drink. ?Any change in your medicines. ?If you have a migraine headache: ?Avoid things that make your symptoms worse, such as bright lights. ?It may help to lie down in a dark, quiet room. ?Do not drive or use heavy machinery. ?Ask your doctor what activities are safe for you. ?Keep all follow-up visits as told by your doctor. This is important. ?Contact a doctor if: ?You get a migraine   headache that is different or worse than others you have had. ?You have more than 15 headache days in one  month. ?Get help right away if: ?Your migraine headache gets very bad. ?Your migraine headache lasts longer than 72 hours. ?You have a fever. ?You have a stiff neck. ?You have trouble seeing. ?Your muscles feel weak or like you cannot control them. ?You start to lose your balance a lot. ?You start to have trouble walking. ?You pass out (faint). ?You have a seizure. ?Summary ?A migraine headache is a very strong throbbing pain on one side or both sides of your head. These headaches can also cause other symptoms. ?This condition may be treated with medicines and changes to your lifestyle. ?Keep a journal to find out what may bring on your migraine headaches. ?Contact a doctor if you get a migraine headache that is different or worse than others you have had. ?Contact your doctor if you have more than 15 headache days in a month. ?This information is not intended to replace advice given to you by your health care provider. Make sure you discuss any questions you have with your health care provider. ?Document Revised: 10/06/2018 Document Reviewed: 07/27/2018 ?Elsevier Patient Education ? 2023 [employer] Inc[employer] ? ?

## 2022-04-20 ENCOUNTER — Ambulatory Visit: Payer: BC Managed Care – PPO | Admitting: Nurse Practitioner

## 2022-04-20 ENCOUNTER — Encounter: Payer: Self-pay | Admitting: Nurse Practitioner

## 2022-04-20 ENCOUNTER — Ambulatory Visit (INDEPENDENT_AMBULATORY_CARE_PROVIDER_SITE_OTHER): Payer: BC Managed Care – PPO | Admitting: Nurse Practitioner

## 2022-04-20 VITALS — BP 129/77 | HR 73 | Temp 98.3°F | Wt 185.3 lb

## 2022-04-20 DIAGNOSIS — G43E09 Chronic migraine with aura, not intractable, without status migrainosus: Secondary | ICD-10-CM | POA: Diagnosis not present

## 2022-04-20 DIAGNOSIS — Z23 Encounter for immunization: Secondary | ICD-10-CM

## 2022-04-20 DIAGNOSIS — G4733 Obstructive sleep apnea (adult) (pediatric): Secondary | ICD-10-CM

## 2022-04-20 DIAGNOSIS — E1159 Type 2 diabetes mellitus with other circulatory complications: Secondary | ICD-10-CM

## 2022-04-20 DIAGNOSIS — I152 Hypertension secondary to endocrine disorders: Secondary | ICD-10-CM

## 2022-04-20 MED ORDER — TOPIRAMATE 25 MG PO TABS: ORAL_TABLET | ORAL | 12 refills | Status: DC

## 2022-04-20 MED ORDER — RIZATRIPTAN BENZOATE 5 MG PO TABS: 5.0000 mg | ORAL_TABLET | ORAL | 0 refills | Status: DC | PRN

## 2022-04-20 NOTE — Assessment & Plan Note (Signed)
Not using CPAP, did not tolerate masks -- recommend she reach out to sleep provider to discuss as would benefit from treatment.  Discussed that migraines and daytime fatigue could be related to OSA poor control.

## 2022-04-20 NOTE — Assessment & Plan Note (Signed)
Chronic, ongoing with BP at goal in office today.  Recommend she monitor BP at least a few mornings a week at home and document.  DASH diet at home.  Continue current medication regimen and adjust as needed.  Labs up to date.  Consider change to Candesartan in future, which may also benefit migraines.

## 2022-04-20 NOTE — Assessment & Plan Note (Signed)
Chronic, ongoing with worsening due to new lights at her work which is a trigger.  She is interviewing for new job elsewhere today.  Side effects with Amitriptyline (dry mouth) and Nurtec (dizziness).   - At this time will change to Topamax, educated her on this and side effects (including SI, which she is to alert provider of if presents).  Recommend she start off taking at night.   - Will trial Maxalt for acute episodes at low dose and advised her to watch BP with this, if elevations to alert PCP.   - She is on ARB for BP, however not Candesartan which is beneficial for migraines (may consider change next visit).   - If ongoing migraines and no benefit from new regimen, then consider injectable (Aimovig) -- discussed with patient at length plan of care.

## 2022-04-20 NOTE — Progress Notes (Signed)
BP 129/77   Pulse 73   Temp 98.3 F (36.8 C) (Oral)   Wt 185 lb 4.8 oz (84.1 kg)   SpO2 97%   BMI 35.01 kg/m    Subjective:    Patient ID: Tiffany Odom, female    DOB: Nov 20, 1975, 46 y.o.   MRN: [id number]  HPI: Tiffany Odom is a 46 y.o. female  Chief Complaint  Patient presents with   Hospitalization Follow-up    ER follow-up -- hypertension and headache   ER FOLLOW UP Seen in ER on 04/15/22 and 04/16/22 for elevated BP and migraines.  [facility] ER on 04/16/22 -- reassuring labs on CBC, CMP, flu testing.  CT of head done at [facility] and this was normal.  On Wednesday, the 18th, she had an acute migraine at work, took [employer] and started with increased dizziness throughout day after taking this.  Currently having migraines 5 days a week, which she thinks is due to new lighting at her workplace [employer] -- migraines getting worse at work.  Took Amitriptyline one time and got dry mouth with this. Did have 4 days where did not take medications prior to recent events.  Not currently using CPAP, has been sleeping well.  Does not have equipment.   Time since discharge: 4 days Hospital/facility: [facility] Diagnosis: Migraines Procedures/tests: as above Consultants: none New medications: none Discharge instructions:  follow-up with PCP Status: stable   Relevant past medical, surgical, family and social history reviewed and updated as indicated. Interim medical history since our last visit reviewed. Allergies and medications reviewed and updated.  Review of Systems  Constitutional:  Positive for fatigue. Negative for activity change, appetite change, diaphoresis and fever.  Respiratory:  Negative for cough, chest tightness and shortness of breath.   Cardiovascular:  Negative for chest pain, palpitations and leg swelling.  Gastrointestinal: Negative.   Endocrine: Negative for cold intolerance, heat intolerance, polydipsia, polyphagia and polyuria.  Neurological:   Positive for headaches. Negative for dizziness, syncope, weakness and numbness.  Psychiatric/Behavioral: Negative.     Per HPI unless specifically indicated above     Objective:    BP 129/77   Pulse 73   Temp 98.3 F (36.8 C) (Oral)   Wt 185 lb 4.8 oz (84.1 kg)   SpO2 97%   BMI 35.01 kg/m   Wt Readings from Last 3 Encounters:  04/20/22 185 lb 4.8 oz (84.1 kg)  04/14/22 190 lb (86.2 kg)  02/17/22 188 lb 3.2 oz (85.4 kg)    Physical Exam Vitals and nursing note reviewed.  Constitutional:      General: She is awake. She is not in acute distress.    Appearance: She is well-developed. She is not ill-appearing.  HENT:     Head: Normocephalic.     Right Ear: Hearing normal.     Left Ear: Hearing normal.  Eyes:     General: Lids are normal.        Right eye: No discharge.        Left eye: No discharge.     Conjunctiva/sclera: Conjunctivae normal.  Pulmonary:     Effort: Pulmonary effort is normal. No accessory muscle usage or respiratory distress.  Musculoskeletal:     Cervical back: Normal range of motion.  Neurological:     Mental Status: She is alert and oriented to person, place, and time.     Cranial Nerves: Cranial nerves 2-12 are intact.     Motor: Motor function is intact.  Coordination: Coordination is intact.     Deep Tendon Reflexes: Reflexes are normal and symmetric.     Reflex Scores:      Brachioradialis reflexes are 2+ on the right side and 2+ on the left side.      Patellar reflexes are 2+ on the right side and 2+ on the left side. Psychiatric:        Attention and Perception: Attention normal.        Mood and Affect: Mood normal.        Behavior: Behavior normal. Behavior is cooperative.        Thought Content: Thought content normal.        Judgment: Judgment normal.    Results for orders placed or performed during the hospital encounter of 16/10/96  Basic metabolic panel  Result Value Ref Range   Sodium 136 135 - 145 mmol/L   Potassium 3.6 3.5  - 5.1 mmol/L   Chloride 98 98 - 111 mmol/L   CO2 26 22 - 32 mmol/L   Glucose, Bld 197 (H) 70 - 99 mg/dL   BUN 23 (H) 6 - 20 mg/dL   Creatinine, Ser 0.93 0.44 - 1.00 mg/dL   Calcium 9.9 8.9 - 10.3 mg/dL   GFR, Estimated >60 >60 mL/min   Anion gap 12 5 - 15  CBC  Result Value Ref Range   WBC 17.8 (H) 4.0 - 10.5 K/uL   RBC 4.69 3.87 - 5.11 MIL/uL   Hemoglobin 14.2 12.0 - 15.0 g/dL   HCT 42.6 36.0 - 46.0 %   MCV 90.8 80.0 - 100.0 fL   MCH 30.3 26.0 - 34.0 pg   MCHC 33.3 30.0 - 36.0 g/dL   RDW 11.8 11.5 - 15.5 %   Platelets 396 150 - 400 K/uL   nRBC 0.0 0.0 - 0.2 %  Urinalysis, Routine w reflex microscopic  Result Value Ref Range   Color, Urine YELLOW (A) YELLOW   APPearance HAZY (A) CLEAR   Specific Gravity, Urine 1.020 1.005 - 1.030   pH 7.0 5.0 - 8.0   Glucose, UA NEGATIVE NEGATIVE mg/dL   Hgb urine dipstick NEGATIVE NEGATIVE   Bilirubin Urine NEGATIVE NEGATIVE   Ketones, ur 5 (A) NEGATIVE mg/dL   Protein, ur 30 (A) NEGATIVE mg/dL   Nitrite NEGATIVE NEGATIVE   Leukocytes,Ua SMALL (A) NEGATIVE   RBC / HPF 0-5 0 - 5 RBC/hpf   WBC, UA 0-5 0 - 5 WBC/hpf   Bacteria, UA RARE (A) NONE SEEN   Squamous Epithelial / LPF 0-5 0 - 5   Mucus PRESENT    Budding Yeast PRESENT   Troponin I (High Sensitivity)  Result Value Ref Range   Troponin I (High Sensitivity) 4 <18 ng/L  Troponin I (High Sensitivity)  Result Value Ref Range   Troponin I (High Sensitivity) 3 <18 ng/L      Assessment & Plan:   Problem List Items Addressed This Visit       Cardiovascular and Mediastinum   Hypertension associated with diabetes ([place])    Chronic, ongoing with BP at goal in office today.  Recommend she monitor BP at least a few mornings a week at home and document.  DASH diet at home.  Continue current medication regimen and adjust as needed.  Labs up to date.  Consider change to Candesartan in future, which may also benefit migraines.        Migraine - Primary    Chronic, ongoing with worsening  due to  new lights at her work which is a [employer].  She is interviewing for new job elsewhere today.  Side effects with Amitriptyline (dry mouth) and Nurtec (dizziness).   - At this time will change to Topamax, educated her on this and side effects (including SI, which she is to alert provider of if presents).  Recommend she start off taking at night.   - Will trial Maxalt for acute episodes at low dose and advised her to watch BP with this, if elevations to alert PCP.   - She is on ARB for BP, however not Candesartan which is beneficial for migraines (may consider change next visit).   - If ongoing migraines and no benefit from new regimen, then consider injectable (Aimovig) -- discussed with patient at length plan of care.      Relevant Medications   topiramate (TOPAMAX) 25 MG tablet   rizatriptan (MAXALT) 5 MG tablet     Respiratory   OSA (obstructive sleep apnea)    Not using CPAP, did not tolerate masks -- recommend she reach out to sleep provider to discuss as would benefit from treatment.  Discussed that migraines and daytime fatigue could be related to OSA poor control.      Other Visit Diagnoses     Need for influenza vaccination       Relevant Orders   Flu Vaccine QUAD 48mo+IM (Fluarix, Fluzone & Alfiuria Quad PF) (Completed)        Follow up plan: Return in about 4 weeks (around 05/18/2022) for Migraines.

## 2022-04-28 DIAGNOSIS — Z419 Encounter for procedure for purposes other than remedying health state, unspecified: Secondary | ICD-10-CM | POA: Diagnosis not present

## 2022-05-16 NOTE — Patient Instructions (Incomplete)
Migraine Headache  A migraine headache is a very strong throbbing pain on one side or both sides of your head. This type of headache can also cause other symptoms. It can last from 4 hours to 3 days. Talk with your doctor about what things may bring on (trigger) this condition.  What are the causes?  The exact cause of this condition is not known. This condition may be triggered or caused by:  Drinking alcohol.  Smoking.  Taking medicines, such as:  Medicine used to treat chest pain (nitroglycerin).  Birth control pills.  Estrogen.  Some blood pressure medicines.  Eating or drinking certain products.  Doing physical activity.  Other things that may trigger a migraine headache include:  Having a menstrual period.  Pregnancy.  Hunger.  Stress.  Not getting enough sleep or getting too much sleep.  Weather changes.  Tiredness (fatigue).  What increases the risk?  Being 25-55 years old.  Being female.  Having a family history of migraine headaches.  Being Caucasian.  Having depression or anxiety.  Being very overweight.  What are the signs or symptoms?  A throbbing pain. This pain may:  Happen in any area of the head, such as on one side or both sides.  Make it hard to do daily activities.  Get worse with physical activity.  Get worse around bright lights or loud noises.  Other symptoms may include:  Feeling sick to your stomach (nauseous).  Vomiting.  Dizziness.  Being sensitive to bright lights, loud noises, or smells.  Before you get a migraine headache, you may get warning signs (an aura). An aura may include:  Seeing flashing lights or having blind spots.  Seeing bright spots, halos, or zigzag lines.  Having tunnel vision or blurred vision.  Having numbness or a tingling feeling.  Having trouble talking.  Having weak muscles.  Some people have symptoms after a migraine headache (postdromal phase), such as:  Tiredness.  Trouble thinking (concentrating).  How is this treated?  Taking medicines that:  Relieve  pain.  Relieve the feeling of being sick to your stomach.  Prevent migraine headaches.  Treatment may also include:  Having acupuncture.  Avoiding foods that bring on migraine headaches.  Learning ways to control your body functions (biofeedback).  Therapy to help you know and deal with negative thoughts (cognitive behavioral therapy).  Follow these instructions at home:  Medicines  Take over-the-counter and prescription medicines only as told by your doctor.  Ask your doctor if the medicine prescribed to you:  Requires you to avoid driving or using heavy machinery.  Can cause trouble pooping (constipation). You may need to take these steps to prevent or treat trouble pooping:  Drink enough fluid to keep your pee (urine) pale yellow.  Take over-the-counter or prescription medicines.  Eat foods that are high in fiber. These include beans, whole grains, and fresh fruits and vegetables.  Limit foods that are high in fat and sugar. These include fried or sweet foods.  Lifestyle  Do not drink alcohol.  Do not use any products that contain nicotine or tobacco, such as cigarettes, e-cigarettes, and chewing tobacco. If you need help quitting, ask your doctor.  Get at least 8 hours of sleep every night.  Limit and deal with stress.  General instructions  Keep a journal to find out what may bring on your migraine headaches. For example, write down:  What you eat and drink.  How much sleep you get.  Any change in   what you eat or drink.  Any change in your medicines.  If you have a migraine headache:  Avoid things that make your symptoms worse, such as bright lights.  It may help to lie down in a dark, quiet room.  Do not drive or use heavy machinery.  Ask your doctor what activities are safe for you.  Keep all follow-up visits as told by your doctor. This is important.  Contact a doctor if:  You get a migraine headache that is different or worse than others you have had.  You have more than 15 headache days in one month.  Get  help right away if:  Your migraine headache gets very bad.  Your migraine headache lasts longer than 72 hours.  You have a fever.  You have a stiff neck.  You have trouble seeing.  Your muscles feel weak or like you cannot control them.  You start to lose your balance a lot.  You start to have trouble walking.  You pass out (faint).  You have a seizure.  Summary  A migraine headache is a very strong throbbing pain on one side or both sides of your head. These headaches can also cause other symptoms.  This condition may be treated with medicines and changes to your lifestyle.  Keep a journal to find out what may bring on your migraine headaches.  Contact a doctor if you get a migraine headache that is different or worse than others you have had.  Contact your doctor if you have more than 15 headache days in a month.  This information is not intended to replace advice given to you by your health care provider. Make sure you discuss any questions you have with your health care provider.  Document Revised: 11/26/2021 Document Reviewed: 07/27/2018  Elsevier Patient Education  2023 Elsevier Inc[employer]

## 2022-05-18 ENCOUNTER — Ambulatory Visit: Payer: BC Managed Care – PPO | Admitting: Nurse Practitioner

## 2022-05-23 DIAGNOSIS — I10 Essential (primary) hypertension: Secondary | ICD-10-CM | POA: Diagnosis not present

## 2022-05-23 DIAGNOSIS — J0100 Acute maxillary sinusitis, unspecified: Secondary | ICD-10-CM | POA: Diagnosis not present

## 2022-05-26 ENCOUNTER — Ambulatory Visit: Payer: BC Managed Care – PPO | Admitting: Nurse Practitioner

## 2022-05-28 DIAGNOSIS — Z419 Encounter for procedure for purposes other than remedying health state, unspecified: Secondary | ICD-10-CM | POA: Diagnosis not present

## 2022-05-31 ENCOUNTER — Encounter: Payer: Self-pay | Admitting: Nurse Practitioner

## 2022-06-01 ENCOUNTER — Other Ambulatory Visit: Payer: Self-pay

## 2022-06-01 MED ORDER — FLUOXETINE HCL 40 MG PO CAPS: 40.0000 mg | ORAL_CAPSULE | Freq: Every day | ORAL | 4 refills | Status: DC

## 2022-06-01 NOTE — Telephone Encounter (Signed)
Patient would like to have her Fluoxetine sent to the [facility] in [place] please

## 2022-06-23 DIAGNOSIS — Z20818 Contact with and (suspected) exposure to other bacterial communicable diseases: Secondary | ICD-10-CM | POA: Diagnosis not present

## 2022-06-28 DIAGNOSIS — Z419 Encounter for procedure for purposes other than remedying health state, unspecified: Secondary | ICD-10-CM | POA: Diagnosis not present

## 2022-08-19 ENCOUNTER — Telehealth: Payer: Self-pay

## 2022-08-19 NOTE — Telephone Encounter (Signed)
Paperwork faxed back to [facility]

## 2022-08-23 ENCOUNTER — Telehealth: Payer: Self-pay

## 2022-08-23 NOTE — Telephone Encounter (Signed)
Paperwork faxed back to [facility]

## 2022-08-25 ENCOUNTER — Telehealth: Payer: Self-pay

## 2022-08-25 ENCOUNTER — Encounter: Payer: Self-pay | Admitting: Nurse Practitioner

## 2022-08-25 ENCOUNTER — Ambulatory Visit (INDEPENDENT_AMBULATORY_CARE_PROVIDER_SITE_OTHER): Payer: BC Managed Care – PPO | Admitting: Nurse Practitioner

## 2022-08-25 DIAGNOSIS — E1159 Type 2 diabetes mellitus with other circulatory complications: Secondary | ICD-10-CM | POA: Diagnosis not present

## 2022-08-25 DIAGNOSIS — G43E09 Chronic migraine with aura, not intractable, without status migrainosus: Secondary | ICD-10-CM | POA: Diagnosis not present

## 2022-08-25 DIAGNOSIS — E559 Vitamin D deficiency, unspecified: Secondary | ICD-10-CM

## 2022-08-25 DIAGNOSIS — E785 Hyperlipidemia, unspecified: Secondary | ICD-10-CM | POA: Diagnosis not present

## 2022-08-25 DIAGNOSIS — I152 Hypertension secondary to endocrine disorders: Secondary | ICD-10-CM

## 2022-08-25 DIAGNOSIS — Z23 Encounter for immunization: Secondary | ICD-10-CM | POA: Diagnosis not present

## 2022-08-25 DIAGNOSIS — Z1231 Encounter for screening mammogram for malignant neoplasm of breast: Secondary | ICD-10-CM | POA: Diagnosis not present

## 2022-08-25 DIAGNOSIS — E1169 Type 2 diabetes mellitus with other specified complication: Secondary | ICD-10-CM | POA: Diagnosis not present

## 2022-08-25 DIAGNOSIS — Z0000 Encounter for general adult medical examination without abnormal findings: Secondary | ICD-10-CM | POA: Diagnosis not present

## 2022-08-25 DIAGNOSIS — Z1211 Encounter for screening for malignant neoplasm of colon: Secondary | ICD-10-CM

## 2022-08-25 DIAGNOSIS — N3281 Overactive bladder: Secondary | ICD-10-CM | POA: Insufficient documentation

## 2022-08-25 DIAGNOSIS — F32A Depression, unspecified: Secondary | ICD-10-CM

## 2022-08-25 DIAGNOSIS — F419 Anxiety disorder, unspecified: Secondary | ICD-10-CM

## 2022-08-25 DIAGNOSIS — G4733 Obstructive sleep apnea (adult) (pediatric): Secondary | ICD-10-CM

## 2022-08-25 LAB — MICROALBUMIN, URINE WAIVED
Creatinine, Urine Waived: 100 mg/dL (ref 10–300)
Microalb, Ur Waived: 30 mg/L — ABNORMAL HIGH (ref 0–19)
Microalb/Creat Ratio: <30 mg/g (ref <30)

## 2022-08-25 LAB — BAYER DCA HB A1C WAIVED: HB A1C (BAYER DCA - WAIVED): 6.7 % — ABNORMAL HIGH (ref 4.8–5.6)

## 2022-08-25 MED ORDER — ATORVASTATIN CALCIUM 80 MG PO TABS: 80.0000 mg | ORAL_TABLET | Freq: Every day | ORAL | 4 refills | Status: DC

## 2022-08-25 MED ORDER — IRBESARTAN 300 MG PO TABS: 300.0000 mg | ORAL_TABLET | Freq: Every day | ORAL | 3 refills | Status: DC

## 2022-08-25 MED ORDER — OXYBUTYNIN CHLORIDE ER 5 MG PO TB24: 5.0000 mg | ORAL_TABLET | Freq: Every day | ORAL | 1 refills | Status: DC

## 2022-08-25 MED ORDER — FLUOXETINE HCL 40 MG PO CAPS: 40.0000 mg | ORAL_CAPSULE | Freq: Every day | ORAL | 4 refills | Status: DC

## 2022-08-25 MED ORDER — EZETIMIBE 10 MG PO TABS: 10.0000 mg | ORAL_TABLET | Freq: Every day | ORAL | 4 refills | Status: DC

## 2022-08-25 NOTE — Assessment & Plan Note (Signed)
Chronic, ongoing.  BP above goal today.  Will increase Irbesartan to 300 MG daily due to elevations above goal.  Recommend she monitor BP at least a few mornings a week at home and document.  DASH diet at home.  Labs: CBC, CMP, TSH, urine ALB.  Urine ALB 30 on check February 2023.  Could consider change to Candesartan in future, which may also benefit migraines.  Return in 4 weeks for BP check.

## 2022-08-25 NOTE — Telephone Encounter (Signed)
Contacted patient to schedule colonoscopy. She is planning on getting ready to go out of town.  She will call back to schedule later.  Informed her that we will send her a reminder letter.  Thanks,  [place], [place]

## 2022-08-25 NOTE — Assessment & Plan Note (Signed)
Improved at this time with glass prescription changes, no current medication use.

## 2022-08-25 NOTE — Assessment & Plan Note (Signed)
Chronic, ongoing with A1c 6.7% and urine ALB 30 February 2024.  At this time will continue diet control, discussed with patient options available if elevation in future; such as GLP1, SGLT2, or Metformin.  She is working on diet changes and regular walks.  Recommend she check blood sugar at home a few days a week with goal in morning fasting <130 and goal 2 hours after eating <180.

## 2022-08-25 NOTE — Assessment & Plan Note (Signed)
Noted on past labs, she is taking supplement.  Recheck levels today.

## 2022-08-25 NOTE — Patient Instructions (Signed)
Please call to schedule your mammogram and/or bone density: [facility]  Address: [address], [place], [place] [address] Phone: [phone]  [facility] [place] [address] [place],  [place]  [address] Phone: [phone]   

## 2022-08-25 NOTE — Assessment & Plan Note (Signed)
Not using CPAP, did not tolerate masks -- recommend she reach out to sleep provider to discuss as would benefit from treatment.  Could consider Inspire in future.

## 2022-08-25 NOTE — Assessment & Plan Note (Signed)
Chronic, stable, denies SI/HI.  Has been on Prozac with benefit for years, will continue at 35 MG at this time as is offering benefit.

## 2022-08-25 NOTE — Assessment & Plan Note (Signed)
Chronic, ongoing. Continue current medication regimen and adjust as needed.  Lipid panel today. 

## 2022-08-25 NOTE — Assessment & Plan Note (Signed)
BMI 36.56 with T2DM, HTN/HLD.  Recommended eating smaller high protein, low fat meals more frequently and exercising 30 mins a day 5 times a week with a goal of 10-15lb weight loss in the next 3 months. Patient voiced their understanding and motivation to adhere to these recommendations.

## 2022-08-25 NOTE — Assessment & Plan Note (Signed)
Chronic since having son 10 years ago, now with worsening.  Appears to be mixed incontinence issue, some triggered by stressors and other times no stressors and leakage present.  Will trial Ditropan XL 5 MG daily, educated her on this and side effects -- if dry mouth she reports she may stop taking. Provided her information on pelvic physical therapy, sheet of exercises taken home.  Referral to urology.

## 2022-08-25 NOTE — Progress Notes (Addendum)
BP (!) 142/88 (BP Location: Left Arm, Patient Position: Sitting, Cuff Size: Normal)   Pulse 67   Temp 98.2 F (36.8 C) (Oral)   Ht 5' 0.98" (1.549 m)   Wt 193 lb 6.4 oz (87.7 kg)   SpO2 97%   BMI 36.56 kg/m    Subjective:    Patient ID: Tiffany Odom, female    DOB: 1976-02-06, 47 y.o.   MRN: [id number]  HPI: Khira Manfredo is a 47 y.o. female presenting on 08/25/2022 for comprehensive medical examination. Current medical complaints include:none  She currently lives with: children Menopausal Symptoms: no  DIABETES Last A1c 6.4% in August and has always maintained on diet control. Never medications for this.  Is having bladder issues with more leakage using Pose pads, can pee in pad as walking and can not stop it.  When sneezes or coughs will have automatic leakage of urine.  This issue started after having her son 10 years ago via c-section.  Over last year this has become worse.   Hypoglycemic episodes:no Polydipsia/polyuria: no Visual disturbance: no Chest pain: no Paresthesias: no Glucose Monitoring: no             Accucheck frequency: Not Checking             Fasting glucose:             Post prandial:             Evening:             Before meals: Taking Insulin?: no             Long acting insulin:             Short acting insulin: Blood Pressure Monitoring: rarely Retinal Examination: Up to Date at [facility] [employer], but no diabetes check Foot Exam: Up to Date Pneumovax: Up To Date Influenza: Up To Date Aspirin: no    HYPERTENSION / HYPERLIPIDEMIA Currently taking Atorvastatin and Irbesartan daily -- started on Irbesartan back in April or May 2022 due to cough with ACE.  Had cough with Lisinopril after being on this for 10 years.  Rosuvastatin caused pruritus.    History of migraines, but non recently since new glasses prescriptions. Satisfied with current treatment? yes Duration of hypertension: chronic BP monitoring frequency: sometimes BP range:  BP  medication side effects: no Past BP meds: Lisinopril Duration of hyperlipidemia: chronic Cholesterol medication side effects: no Cholesterol supplements: none Past cholesterol medications: Rosuvastatin Medication compliance: good compliance Aspirin: no Recent stressors: no Recurrent headaches: no Visual changes: no Palpitations: no Dyspnea: no Chest pain: no Lower extremity edema: no Dizzy/lightheaded: no  The 10-year ASCVD risk score (Arnett DK, et al., 2019) is: 5.9%   Values used to calculate the score:     Age: 72 years     Sex: Female     Is Non-Hispanic African American: No     Diabetic: Yes     Tobacco smoker: No     Systolic Blood Pressure: [id number] mmHg     Is BP treated: Yes     HDL Cholesterol: 37 mg/dL     Total Cholesterol: 221 mg/dL  SLEEP APNEA Diagnosed with moderate OSA by [facility] -- tried nasal mask and full face mask but both caused worsening sleep and breathing for her.  Sleep has improved with diet changes. Sleep apnea status: stable Duration: chronic Wakes feeling refreshed:  no Daytime hypersomnolence:  no Fatigue: no Insomnia: no Good sleep hygiene:  yes  Difficulty falling asleep:  no Difficulty staying asleep: no Snoring bothers bed partner:  no Observed apnea by bed partner: no Obesity:  yes Hypertension: yes  Pulmonary hypertension:  no Coronary artery disease:  no    DEPRESSION Takes Prozac 40 MG daily for depression daily, been on for >15 years.  Mood status: stable Satisfied with current treatment?: yes Symptom severity: moderate  Duration of current treatment : chronic Side effects: no Medication compliance: good compliance Psychotherapy/counseling: yes in past Previous psychiatric medications: none Depressed mood: no Anxious mood: no Anhedonia: no Significant weight loss or gain: no Insomnia: none Fatigue: yes Feelings of worthlessness or guilt: no Impaired concentration/indecisiveness: no Suicidal ideations:  no Hopelessness: no Crying spells: no    08/25/2022    8:44 AM 04/20/2022   11:34 AM 02/17/2022    9:53 AM 10/23/2021    9:12 AM 08/06/2021    8:12 AM  Depression screen PHQ 2/9  Decreased Interest '1 2 2 2 2  '$ Down, Depressed, Hopeless '2 2 1 1 2  '$ PHQ - 2 Score '3 4 3 3 4  '$ Altered sleeping '1 1 1 2 3  '$ Tired, decreased energy '1 3 3 2 3  '$ Change in appetite '2 2 2 3 2  '$ Feeling bad or failure about yourself  '1 2 2 2 2  '$ Trouble concentrating '1 1 1 2 2  '$ Moving slowly or fidgety/restless 0 0 0 0 0  Suicidal thoughts 0 0 1 0 0  PHQ-9 Score '9 13 13 14 16  '$ Difficult doing work/chores Somewhat difficult Somewhat difficult Somewhat difficult         04/20/2022   11:34 AM 02/17/2022    9:54 AM 10/23/2021    9:13 AM 08/06/2021    8:12 AM  GAD 7 : Generalized Anxiety Score  Nervous, Anxious, on Edge '3 1 2 2  '$ Control/stop worrying '3 2 2 3  '$ Worry too much - different things '3 2 2 2  '$ Trouble relaxing '2 1 2 3  '$ Restless '2 1 1 1  '$ Easily annoyed or irritable '2 1 2 3  '$ Afraid - awful might happen '2 2 2 3  '$ Total GAD 7 Score '17 10 13 17  '$ Anxiety Difficulty Somewhat difficult Somewhat difficult Somewhat difficult Somewhat difficult        08/06/2021    8:12 AM 02/17/2022    9:53 AM 04/20/2022   11:33 AM 08/25/2022    8:44 AM 08/25/2022    9:18 AM  Fall Risk  Falls in the past year? 0 1 0 0 0  Was there an injury with Fall? 0 0 0 0 0  Fall Risk Category Calculator 0 1 0 0 0  Fall Risk Category (Retired) Low Low Low    (RETIRED) Patient Fall Risk Level Low fall risk Low fall risk Low fall risk    Patient at Risk for Falls Due to No Fall Risks No Fall Risks No Fall Risks No Fall Risks No Fall Risks  Fall risk Follow up Falls evaluation completed Falls evaluation completed Falls evaluation completed Falls evaluation completed Falls prevention discussed    Functional Status Survey: Is the patient deaf or have difficulty hearing?: No Does the patient have difficulty seeing, even when wearing  glasses/contacts?: No Does the patient have difficulty concentrating, remembering, or making decisions?: No Does the patient have difficulty walking or climbing stairs?: No Does the patient have difficulty dressing or bathing?: No Does the patient have difficulty doing errands alone such as visiting a doctor's office  or shopping?: No   Past Medical History:  Past Medical History:  Diagnosis Date   Depression    Diabetes ([place])    Hyperlipemia    Hypertension     Surgical History:  Past Surgical History:  Procedure Laterality Date   BILATERAL CARPAL TUNNEL RELEASE     CESAREAN SECTION     CHOLECYSTECTOMY     TOTAL ABDOMINAL HYSTERECTOMY W/ BILATERAL SALPINGOOPHORECTOMY      Medications:  Current Outpatient Medications on File Prior to Visit  Medication Sig   Blood Glucose Monitoring Suppl (ONETOUCH VERIO) w/Device KIT Use 1-4 times daily as needed/directed DX E11.9   glucose blood test strip Use to check blood sugar 1-2 times daily.   Lancets (FREESTYLE) lancets Use to check blood sugar 1-2 times daily.   No current facility-administered medications on file prior to visit.    Allergies:  Allergies  Allergen Reactions   Oxycodone-Acetaminophen Rash    Overly sedated on Percocet at time of lithotripsy 2010   Sulfamethoxazole-Trimethoprim Rash    Drug rash January 2017   Lisinopril Cough   Rosuvastatin Other (See Comments)   Cyclobenzaprine Rash    "rasied Blood pressure"     Social History:  Social History   Socioeconomic History   Marital status: Divorced    Spouse name: Not on file   Number of children: Not on file   Years of education: Not on file   Highest education level: Not on file  Occupational History   Not on file  Tobacco Use   Smoking status: Never   Smokeless tobacco: Never  Vaping Use   Vaping Use: Never used  Substance and Sexual Activity   Alcohol use: Yes    Comment: OCC   Drug use: Never   Sexual activity: Not on file  Other Topics  Concern   Not on file  Social History Narrative   Not on file   Social Determinants of Health   Financial Resource Strain: Low Risk  (04/07/2021)   Overall Financial Resource Strain (CARDIA)    Difficulty of Paying Living Expenses: Not hard at all  Food Insecurity: No Food Insecurity (04/07/2021)   Hunger Vital Sign    Worried About Running Out of Food in the Last Year: Never true    [place] in the Last Year: Never true  Transportation Needs: No Transportation Needs (04/07/2021)   PRAPARE - Hydrologist (Medical): No    Lack of Transportation (Non-Medical): No  Physical Activity: Insufficiently Active (04/07/2021)   Exercise Vital Sign    Days of Exercise per Week: 4 days    Minutes of Exercise per Session: 30 min  Stress: No Stress Concern Present (04/07/2021)   [place]    Feeling of Stress : Only a little  Social Connections: Socially Isolated (04/07/2021)   Social Connection and Isolation Panel [NHANES]    Frequency of Communication with Friends and Family: Three times a week    Frequency of Social Gatherings with Friends and Family: Three times a week    Attends Religious Services: Never    Active Member of Clubs or Organizations: Not on file    Attends Club or Organization Meetings: Never    Marital Status: Separated  Intimate Partner Violence: Not At Risk (04/07/2021)   Humiliation, Afraid, Rape, and Kick questionnaire    Fear of Current or Ex-Partner: No    Emotionally Abused: No  Physically Abused: No    Sexually Abused: No   Social History   Tobacco Use  Smoking Status Never  Smokeless Tobacco Never   Social History   Substance and Sexual Activity  Alcohol Use Yes   Comment: OCC    Family History:  Family History  Problem Relation Age of Onset   Diabetes Mother    ADD / ADHD Mother    Heart Problems Mother    Sleep apnea Mother    Diabetes  Father    Heart Problems Father    Healthy Sister    Healthy Sister    Healthy Sister    Healthy Sister    Healthy Sister    Healthy Brother    Heart Problems Maternal Grandmother    Diabetes Maternal Grandmother    Heart Problems Paternal Grandmother    Diabetes Paternal Grandmother     Past medical history, surgical history, medications, allergies, family history and social history reviewed with patient today and changes made to appropriate areas of the chart.   ROS All other ROS negative except what is listed above and in the HPI.      Objective:    BP (!) 142/88 (BP Location: Left Arm, Patient Position: Sitting, Cuff Size: Normal)   Pulse 67   Temp 98.2 F (36.8 C) (Oral)   Ht 5' 0.98" (1.549 m)   Wt 193 lb 6.4 oz (87.7 kg)   SpO2 97%   BMI 36.56 kg/m   Wt Readings from Last 3 Encounters:  08/25/22 193 lb 6.4 oz (87.7 kg)  04/20/22 185 lb 4.8 oz (84.1 kg)  04/14/22 190 lb (86.2 kg)    Physical Exam Vitals and nursing note reviewed. Exam conducted with a chaperone present.  Constitutional:      General: She is awake. She is not in acute distress.    Appearance: She is well-developed and well-groomed. She is obese. She is not ill-appearing or toxic-appearing.  HENT:     Head: Normocephalic and atraumatic.     Right Ear: Hearing, tympanic membrane, ear canal and external ear normal. No drainage.     Left Ear: Hearing, tympanic membrane, ear canal and external ear normal. No drainage.     Nose: Nose normal.     Right Sinus: No maxillary sinus tenderness or frontal sinus tenderness.     Left Sinus: No maxillary sinus tenderness or frontal sinus tenderness.     Mouth/Throat:     Mouth: Mucous membranes are moist.     Pharynx: Oropharynx is clear. Uvula midline. No pharyngeal swelling, oropharyngeal exudate or posterior oropharyngeal erythema.  Eyes:     General: Lids are normal.        Right eye: No discharge.        Left eye: No discharge.     Extraocular  Movements: Extraocular movements intact.     Conjunctiva/sclera: Conjunctivae normal.     Pupils: Pupils are equal, round, and reactive to light.     Visual Fields: Right eye visual fields normal and left eye visual fields normal.  Neck:     Thyroid: No thyromegaly.     Vascular: No carotid bruit.     Trachea: Trachea normal.  Cardiovascular:     Rate and Rhythm: Normal rate and regular rhythm.     Heart sounds: Normal heart sounds. No murmur heard.    No gallop.  Pulmonary:     Effort: Pulmonary effort is normal. No accessory muscle usage or respiratory distress.  Breath sounds: Normal breath sounds.  Abdominal:     General: Bowel sounds are normal.     Palpations: Abdomen is soft. There is no hepatomegaly or splenomegaly.     Tenderness: There is no abdominal tenderness.  Musculoskeletal:        General: Normal range of motion.     Cervical back: Normal range of motion and neck supple.     Right lower leg: No edema.     Left lower leg: No edema.  Lymphadenopathy:     Head:     Right side of head: No submental, submandibular, tonsillar, preauricular or posterior auricular adenopathy.     Left side of head: No submental, submandibular, tonsillar, preauricular or posterior auricular adenopathy.     Cervical: No cervical adenopathy.  Skin:    General: Skin is warm and dry.     Capillary Refill: Capillary refill takes less than 2 seconds.     Findings: No rash.  Neurological:     Mental Status: She is alert and oriented to person, place, and time.     Gait: Gait is intact.     Deep Tendon Reflexes: Reflexes are normal and symmetric.     Reflex Scores:      Brachioradialis reflexes are 2+ on the right side and 2+ on the left side.      Patellar reflexes are 2+ on the right side and 2+ on the left side. Psychiatric:        Attention and Perception: Attention normal.        Mood and Affect: Mood normal.        Speech: Speech normal.        Behavior: Behavior normal. Behavior  is cooperative.        Thought Content: Thought content normal.        Judgment: Judgment normal.    Diabetic Foot Exam - Simple   Simple Foot Form Visual Inspection No deformities, no ulcerations, no other skin breakdown bilaterally: Yes Sensation Testing Intact to touch and monofilament testing bilaterally: Yes Pulse Check Posterior Tibialis and Dorsalis pulse intact bilaterally: Yes Comments     Results for orders placed or performed during the hospital encounter of [id number]  Basic metabolic panel  Result Value Ref Range   Sodium 136 135 - 145 mmol/L   Potassium 3.6 3.5 - 5.1 mmol/L   Chloride 98 98 - 111 mmol/L   CO2 26 22 - 32 mmol/L   Glucose, Bld 197 (H) 70 - 99 mg/dL   BUN 23 (H) 6 - 20 mg/dL   Creatinine, Ser 0.93 0.44 - 1.00 mg/dL   Calcium 9.9 8.9 - 10.3 mg/dL   GFR, Estimated >60 >60 mL/min   Anion gap 12 5 - 15  CBC  Result Value Ref Range   WBC 17.8 (H) 4.0 - 10.5 K/uL   RBC 4.69 3.87 - 5.11 MIL/uL   Hemoglobin 14.2 12.0 - 15.0 g/dL   HCT 42.6 36.0 - 46.0 %   MCV 90.8 80.0 - 100.0 fL   MCH 30.3 26.0 - 34.0 pg   MCHC 33.3 30.0 - 36.0 g/dL   RDW 11.8 11.5 - 15.5 %   Platelets 396 150 - 400 K/uL   nRBC 0.0 0.0 - 0.2 %  Urinalysis, Routine w reflex microscopic  Result Value Ref Range   Color, Urine YELLOW (A) YELLOW   APPearance HAZY (A) CLEAR   Specific Gravity, Urine 1.020 1.005 - 1.030   pH 7.0 5.0 - 8.0  Glucose, UA NEGATIVE NEGATIVE mg/dL   Hgb urine dipstick NEGATIVE NEGATIVE   Bilirubin Urine NEGATIVE NEGATIVE   Ketones, ur 5 (A) NEGATIVE mg/dL   Protein, ur 30 (A) NEGATIVE mg/dL   Nitrite NEGATIVE NEGATIVE   Leukocytes,Ua SMALL (A) NEGATIVE   RBC / HPF 0-5 0 - 5 RBC/hpf   WBC, UA 0-5 0 - 5 WBC/hpf   Bacteria, UA RARE (A) NONE SEEN   Squamous Epithelial / HPF 0-5 0 - 5   Mucus PRESENT    Budding Yeast PRESENT   Troponin I (High Sensitivity)  Result Value Ref Range   Troponin I (High Sensitivity) 4 <18 ng/L  Troponin I (High Sensitivity)   Result Value Ref Range   Troponin I (High Sensitivity) 3 <18 ng/L      Assessment & Plan:   Problem List Items Addressed This Visit       Cardiovascular and Mediastinum   Hypertension associated with diabetes (HCC)    Chronic, ongoing.  BP above goal today.  Will increase Irbesartan to 300 MG daily due to elevations above goal.  Recommend she monitor BP at least a few mornings a week at home and document.  DASH diet at home.  Labs: CBC, CMP, TSH, urine ALB.  Urine ALB 30 on check February 2023.  Could consider change to Candesartan in future, which may also benefit migraines.  Return in 4 weeks for BP check.      Relevant Medications   ezetimibe (ZETIA) 10 MG tablet   atorvastatin (LIPITOR) 80 MG tablet   irbesartan (AVAPRO) 300 MG tablet   Other Relevant Orders   Bayer DCA Hb A1c Waived   Microalbumin, Urine Waived   CBC with Differential/Platelet   Comprehensive metabolic panel   TSH   Migraine    Improved at this time with glass prescription changes, no current medication use.      Relevant Medications   FLUoxetine (PROZAC) 40 MG capsule   ezetimibe (ZETIA) 10 MG tablet   atorvastatin (LIPITOR) 80 MG tablet   irbesartan (AVAPRO) 300 MG tablet     Respiratory   OSA (obstructive sleep apnea)    Not using CPAP, did not tolerate masks -- recommend she reach out to sleep provider to discuss as would benefit from treatment.  Could consider [facility] in future.        Endocrine   Hyperlipidemia associated with type 2 diabetes mellitus (HCC)    Chronic, ongoing.  Continue current medication regimen and adjust as needed.  Lipid panel today.         Relevant Medications   ezetimibe (ZETIA) 10 MG tablet   atorvastatin (LIPITOR) 80 MG tablet   irbesartan (AVAPRO) 300 MG tablet   Other Relevant Orders   Bayer DCA Hb A1c Waived   Comprehensive metabolic panel   Lipid Panel w/o Chol/HDL Ratio   Type 2 diabetes mellitus with morbid obesity ([place]) - Primary    Chronic, ongoing  with A1c 6.7% and urine ALB 30 February 2024.  At this time will continue diet control, discussed with patient options available if elevation in future; such as GLP1, SGLT2, or Metformin.  She is working on diet changes and regular walks.  Recommend she check blood sugar at home a few days a week with goal in morning fasting <130 and goal 2 hours after eating <180.       Relevant Medications   atorvastatin (LIPITOR) 80 MG tablet   irbesartan (AVAPRO) 300 MG tablet   Other Relevant  Orders   Bayer DCA Hb A1c Waived   Microalbumin, Urine Waived     Genitourinary   OAB (overactive bladder)    Chronic since having son 10 years ago, now with worsening.  Appears to be mixed incontinence issue, some triggered by stressors and other times no stressors and leakage present.  Will trial Ditropan XL 5 MG daily, educated her on this and side effects -- if dry mouth she reports she may stop taking. Provided her information on pelvic physical therapy, sheet of exercises taken home.  Referral to urology.      Relevant Orders   Ambulatory referral to Urology     Other   Anxiety and depression    Chronic, stable, denies SI/HI.  Has been on Prozac with benefit for years, will continue at 87 MG at this time as is offering benefit.        Relevant Medications   FLUoxetine (PROZAC) 40 MG capsule   Morbid obesity (HCC)    BMI 36.56 with T2DM, HTN/HLD.  Recommended eating smaller high protein, low fat meals more frequently and exercising 30 mins a day 5 times a week with a goal of 10-15lb weight loss in the next 3 months. Patient voiced their understanding and motivation to adhere to these recommendations.       Vitamin D deficiency    Noted on past labs, she is taking supplement.  Recheck levels today.      Relevant Orders   VITAMIN D 25 Hydroxy (Vit-D Deficiency, Fractures)   Other Visit Diagnoses     Need for Tdap vaccination       Td in office today   Relevant Orders   Td vaccine greater than or  equal to 7yo preservative free IM (Completed)   Encounter for screening mammogram for malignant neoplasm of breast       Mammogram ordered   Relevant Orders   MM 3D SCREEN BREAST BILATERAL   Colon cancer screening       GI referral   Relevant Orders   Ambulatory referral to Gastroenterology   Encounter for annual physical exam       Annual physical today with labs and health maintenance reviewed, discussed with patient.        Follow up plan: Return in about 4 weeks (around 09/22/2022) for HTN and OAB.   LABORATORY TESTING:  - Pap smear: had hysterectomy  IMMUNIZATIONS:   - Tdap: Tetanus vaccination status reviewed: last tetanus booster within 10 years. - Influenza:Up To Date - Pneumovax: Up To date - Prevnar: Not applicable - COVID: Up to date - HPV: Not applicable - Shingrix vaccine: Not applicable  SCREENING: -Mammogram: Ordered today  - Colonoscopy: Referral ordered - Bone Density: Not applicable  -Hearing Test: Not applicable  -Spirometry: Not applicable   PATIENT COUNSELING:   Advised to take 1 mg of folate supplement per day if capable of pregnancy.   Sexuality: Discussed sexually transmitted diseases, partner selection, use of condoms, avoidance of unintended pregnancy  and contraceptive alternatives.   Advised to avoid cigarette smoking.  I discussed with the patient that most people either abstain from alcohol or drink within safe limits (<=14/week and <=4 drinks/occasion for males, <=7/weeks and <= 3 drinks/occasion for females) and that the risk for alcohol disorders and other health effects rises proportionally with the number of drinks per week and how often a drinker exceeds daily limits.  Discussed cessation/primary prevention of drug use and availability of treatment for abuse.   Diet:  Encouraged to adjust caloric intake to maintain  or achieve ideal body weight, to reduce intake of dietary saturated fat and total fat, to limit sodium intake by  avoiding high sodium foods and not adding table salt, and to maintain adequate dietary potassium and calcium preferably from fresh fruits, vegetables, and low-fat dairy products.    Stressed the importance of regular exercise  Injury prevention: Discussed safety belts, safety helmets, smoke detector, smoking near bedding or upholstery.   Dental health: Discussed importance of regular tooth brushing, flossing, and dental visits.    NEXT PREVENTATIVE PHYSICAL DUE IN 1 YEAR. Return in about 4 weeks (around 09/22/2022) for HTN and OAB.

## 2022-08-26 ENCOUNTER — Other Ambulatory Visit: Payer: Self-pay | Admitting: Nurse Practitioner

## 2022-08-26 LAB — COMPREHENSIVE METABOLIC PANEL
ALT: 22 IU/L (ref 0–32)
AST: 15 IU/L (ref 0–40)
Albumin/Globulin Ratio: 2 (ref 1.2–2.2)
Albumin: 4.1 g/dL (ref 3.9–4.9)
Alkaline Phosphatase: 77 IU/L (ref 44–121)
BUN/Creatinine Ratio: 22 (ref 9–23)
BUN: 17 mg/dL (ref 6–24)
Bilirubin Total: 0.3 mg/dL (ref 0.0–1.2)
CO2: 24 mmol/L (ref 20–29)
Calcium: 9.5 mg/dL (ref 8.7–10.2)
Chloride: 101 mmol/L (ref 96–106)
Creatinine, Ser: 0.78 mg/dL (ref 0.57–1.00)
Globulin, Total: 2.1 g/dL (ref 1.5–4.5)
Glucose: 139 mg/dL — ABNORMAL HIGH (ref 70–99)
Potassium: 4.3 mmol/L (ref 3.5–5.2)
Sodium: 139 mmol/L (ref 134–144)
Total Protein: 6.2 g/dL (ref 6.0–8.5)
eGFR: 95 mL/min/{1.73_m2} (ref >59)

## 2022-08-26 LAB — LIPID PANEL W/O CHOL/HDL RATIO
Cholesterol, Total: 145 mg/dL (ref 100–199)
HDL: 38 mg/dL — ABNORMAL LOW (ref >39)
LDL Chol Calc (NIH): 90 mg/dL (ref 0–99)
Triglycerides: 91 mg/dL (ref 0–149)
VLDL Cholesterol Cal: 17 mg/dL (ref 5–40)

## 2022-08-26 LAB — CBC WITH DIFFERENTIAL/PLATELET
Basophils Absolute: 0.1 10*3/uL (ref 0.0–0.2)
Basos: 1 %
EOS (ABSOLUTE): 0.2 10*3/uL (ref 0.0–0.4)
Eos: 3 %
Hematocrit: 41.8 % (ref 34.0–46.6)
Hemoglobin: 13.3 g/dL (ref 11.1–15.9)
Immature Grans (Abs): 0 10*3/uL (ref 0.0–0.1)
Immature Granulocytes: 0 %
Lymphocytes Absolute: 2.9 10*3/uL (ref 0.7–3.1)
Lymphs: 34 %
MCH: 29.9 pg (ref 26.6–33.0)
MCHC: 31.8 g/dL (ref 31.5–35.7)
MCV: 94 fL (ref 79–97)
Monocytes Absolute: 0.4 10*3/uL (ref 0.1–0.9)
Monocytes: 5 %
Neutrophils Absolute: 5 10*3/uL (ref 1.4–7.0)
Neutrophils: 57 %
Platelets: 363 10*3/uL (ref 150–450)
RBC: 4.45 x10E6/uL (ref 3.77–5.28)
RDW: 11.8 % (ref 11.7–15.4)
WBC: 8.6 10*3/uL (ref 3.4–10.8)

## 2022-08-26 LAB — VITAMIN D 25 HYDROXY (VIT D DEFICIENCY, FRACTURES): Vit D, 25-Hydroxy: 14.9 ng/mL — ABNORMAL LOW (ref 30.0–100.0)

## 2022-08-26 LAB — TSH: TSH: 1.27 u[IU]/mL (ref 0.450–4.500)

## 2022-08-26 MED ORDER — CHOLECALCIFEROL 1.25 MG (50000 UT) PO TABS: 1.0000 | ORAL_TABLET | ORAL | 4 refills | Status: DC

## 2022-08-26 NOTE — Progress Notes (Signed)
Contacted via MyChart   Good evening Ibeth, your labs have returned: - Kidney function, creatinine and eGFR, remains normal, as is liver function, AST and ALT.   - Cholesterol levels are trending down!!  Woohoo!!  Continue Atorvastatin and Zetia as ordered. - Vitamin D is low.  I am going to send in higher weekly dose for you to start taking and we will recheck levels next visit.  It is good for bone health and even mental health. - Remainder of labs are all stable.  No changes needed.  Great job!! Keep being amazing!!  Thank you for allowing me to participate in your care.  I appreciate you. Kindest regards, Shady Bradish

## 2022-08-27 DIAGNOSIS — Z419 Encounter for procedure for purposes other than remedying health state, unspecified: Secondary | ICD-10-CM | POA: Diagnosis not present

## 2022-08-30 DIAGNOSIS — R32 Unspecified urinary incontinence: Secondary | ICD-10-CM | POA: Diagnosis not present

## 2022-08-31 ENCOUNTER — Telehealth: Payer: Self-pay

## 2022-08-31 NOTE — Telephone Encounter (Signed)
Paperwork faxed back to [facility]

## 2022-09-16 ENCOUNTER — Ambulatory Visit: Payer: BC Managed Care – PPO | Admitting: Urology

## 2022-09-22 ENCOUNTER — Encounter: Payer: Self-pay | Admitting: Nurse Practitioner

## 2022-09-22 NOTE — Telephone Encounter (Signed)
You are more than welcome.

## 2022-09-22 NOTE — Progress Notes (Unsigned)
There were no vitals taken for this visit.   Subjective:    Patient ID: Tiffany Odom, female    DOB: [birth year], 47 y.o.   MRN: [id number]  HPI: Gissela Luby is a 47 y.o. female  No chief complaint on file.  DIZZINESS RELATED TO HYPERTENSION Patient currently taking Irbesartan 300 mg daily, admits to dizzy spells when her BP is elevated.... Duration: {Blank single:19197::"days","weeks","months"} Description of symptoms: {Blank single:19197::"lightheaded","off kilter","ill-defined","room spinning","not room spinning"} Duration of episode: {Blank single:19197::"seconds","minutes","hours"} Dizziness frequency: {Blank single:19197::"no history of the same","recurrent"} Provoking factors: {Blank single:19197::"none"} Aggravating factors:  {Blank single:19197::"none"} Triggered by rolling over in bed: {Blank single:19197::"yes","no"} Triggered by bending over: {Blank single:19197::"yes","no"} Aggravated by head movement: {Blank single:19197::"yes","no"} Aggravated by exertion, coughing, loud noises: {Blank single:19197::"yes","no"} Recent head injury: {Blank single:19197::"yes","no"} Recent or current viral symptoms: {Blank single:19197::"yes","no"} History of vasovagal episodes: {Blank single:19197::"yes","no"} Nausea: {Blank single:19197::"yes","no"} Vomiting: {Blank single:19197::"yes","no"} Tinnitus: {Blank single:19197::"yes","no"} Hearing loss: {Blank single:19197::"yes","no"} Aural fullness: {Blank single:19197::"yes","no"} Headache: {Blank single:19197::"yes","no"} Photophobia/phonophobia: {Blank single:19197::"yes","no"} Unsteady gait: {Blank single:19197::"yes","no"} Postural instability: {Blank single:19197::"yes","no"} Diplopia, dysarthria, dysphagia or weakness: {Blank single:19197::"yes","no"} Related to exertion: {Blank single:19197::"yes","no"} Pallor: {Blank single:19197::"yes","no"} Diaphoresis: {Blank single:19197::"yes","no"} Dyspnea: {Blank  single:19197::"yes","no"} Chest pain: {Blank single:19197::"yes","no"}   Relevant past medical, surgical, family and social history reviewed and updated as indicated. Interim medical history since our last visit reviewed. Allergies and medications reviewed and updated.  Review of Systems  Per HPI unless specifically indicated above     Objective:    There were no vitals taken for this visit.  Wt Readings from Last 3 Encounters:  08/25/22 193 lb 6.4 oz (87.7 kg)  04/20/22 185 lb 4.8 oz (84.1 kg)  04/14/22 190 lb (86.2 kg)    Physical Exam  Results for orders placed or performed in visit on 08/25/22  Bayer DCA Hb A1c Waived  Result Value Ref Range   HB A1C (BAYER DCA - WAIVED) 6.7 (H) 4.8 - 5.6 %  Microalbumin, Urine Waived  Result Value Ref Range   Microalb, Ur Waived 30 (H) 0 - 19 mg/L   Creatinine, Urine Waived 100 10 - 300 mg/dL   Microalb/Creat Ratio <30 <30 mg/g  CBC with Differential/Platelet  Result Value Ref Range   WBC 8.6 3.4 - 10.8 x10E3/uL   RBC 4.45 3.77 - 5.28 x10E6/uL   Hemoglobin 13.3 11.1 - 15.9 g/dL   Hematocrit 41.8 34.0 - 46.6 %   MCV 94 79 - 97 fL   MCH 29.9 26.6 - 33.0 pg   MCHC 31.8 31.5 - 35.7 g/dL   RDW 11.8 11.7 - 15.4 %   Platelets 363 150 - 450 x10E3/uL   Neutrophils 57 Not Estab. %   Lymphs 34 Not Estab. %   Monocytes 5 Not Estab. %   Eos 3 Not Estab. %   Basos 1 Not Estab. %   Neutrophils Absolute 5.0 1.4 - 7.0 x10E3/uL   Lymphocytes Absolute 2.9 0.7 - 3.1 x10E3/uL   Monocytes Absolute 0.4 0.1 - 0.9 x10E3/uL   EOS (ABSOLUTE) 0.2 0.0 - 0.4 x10E3/uL   Basophils Absolute 0.1 0.0 - 0.2 x10E3/uL   Immature Granulocytes 0 Not Estab. %   Immature Grans (Abs) 0.0 0.0 - 0.1 x10E3/uL  Comprehensive metabolic panel  Result Value Ref Range   Glucose 139 (H) 70 - 99 mg/dL   BUN 17 6 - 24 mg/dL   Creatinine, Ser 0.78 0.57 - 1.00 mg/dL   eGFR 95 >59 mL/min/1.73   BUN/Creatinine Ratio 22 9 - 23   Sodium 139 134 -  144 mmol/L   Potassium 4.3 3.5  - 5.2 mmol/L   Chloride 101 96 - 106 mmol/L   CO2 24 20 - 29 mmol/L   Calcium 9.5 8.7 - 10.2 mg/dL   Total Protein 6.2 6.0 - 8.5 g/dL   Albumin 4.1 3.9 - 4.9 g/dL   Globulin, Total 2.1 1.5 - 4.5 g/dL   Albumin/Globulin Ratio 2.0 1.2 - 2.2   Bilirubin Total 0.3 0.0 - 1.2 mg/dL   Alkaline Phosphatase 77 44 - 121 IU/L   AST 15 0 - 40 IU/L   ALT 22 0 - 32 IU/L  Lipid Panel w/o Chol/HDL Ratio  Result Value Ref Range   Cholesterol, Total 145 100 - 199 mg/dL   Triglycerides 91 0 - 149 mg/dL   HDL 38 (L) >39 mg/dL   VLDL Cholesterol Cal 17 5 - 40 mg/dL   LDL Chol Calc (NIH) 90 0 - 99 mg/dL  TSH  Result Value Ref Range   TSH 1.270 0.450 - 4.500 uIU/mL  VITAMIN D 25 Hydroxy (Vit-D Deficiency, Fractures)  Result Value Ref Range   Vit D, 25-Hydroxy 14.9 (L) 30.0 - 100.0 ng/mL      Assessment & Plan:   Problem List Items Addressed This Visit       Cardiovascular and Mediastinum   Hypertension associated with diabetes ([place]) - Primary   Other Visit Diagnoses     Dizzy spells           Virtual Visit via Telephone Note  I connected with Tiffany Odom on 09/22/22 at  8:40 AM EDT by telephone and verified that I am speaking with the correct person using two identifiers.  Location: Patient: *** Provider: ***   I discussed the limitations, risks, security and privacy concerns of performing an evaluation and management service by telephone and the availability of in person appointments. I also discussed with the patient that there may be a patient responsible charge related to this service. The patient expressed understanding and agreed to proceed.   History of Present Illness:    Observations/Objective:   Assessment and Plan:   Follow Up Instructions:    I discussed the assessment and treatment plan with the patient. The patient was provided an opportunity to ask questions and all were answered. The patient agreed with the plan and demonstrated an understanding of the  instructions.   The patient was advised to call back or seek an in-person evaluation if the symptoms worsen or if the condition fails to improve as anticipated.  I provided *** minutes of non-face-to-face time during this encounter.   Rashelle Pearley    Follow up plan: No follow-ups on file.

## 2022-09-22 NOTE — Telephone Encounter (Signed)
Pt has been called and schedule with Rashelle for a virtual visit on 09/23/2022 @ 8:40

## 2022-09-23 ENCOUNTER — Encounter: Payer: Self-pay | Admitting: Family Medicine

## 2022-09-23 ENCOUNTER — Other Ambulatory Visit: Payer: Self-pay | Admitting: Nurse Practitioner

## 2022-09-23 ENCOUNTER — Telehealth (INDEPENDENT_AMBULATORY_CARE_PROVIDER_SITE_OTHER): Payer: BC Managed Care – PPO | Admitting: Family Medicine

## 2022-09-23 VITALS — BP 183/111

## 2022-09-23 DIAGNOSIS — R42 Dizziness and giddiness: Secondary | ICD-10-CM

## 2022-09-23 DIAGNOSIS — I152 Hypertension secondary to endocrine disorders: Secondary | ICD-10-CM

## 2022-09-23 DIAGNOSIS — E1159 Type 2 diabetes mellitus with other circulatory complications: Secondary | ICD-10-CM | POA: Diagnosis not present

## 2022-09-23 MED ORDER — AMLODIPINE BESYLATE 5 MG PO TABS: 5.0000 mg | ORAL_TABLET | Freq: Every day | ORAL | 0 refills | Status: DC

## 2022-09-23 NOTE — Progress Notes (Deleted)
MyChart Video Visit   {  This SmartText note template is in development as part of the [place].   This template contains optional SmartLists. If no selections are made from an optional SmartList, it will be automatically removed when the note is signed.  Left clicking SmartLists that are in blue, underlined text will show additional information regarding the patient's history unless you are already editing the note in a pop-up window.  This text will be automatically removed from this note when it is signed.:1}   Virtual Visit via Video Note   This visit type was conducted due to national recommendations for restrictions regarding the COVID-19 Pandemic (e.g. social distancing) in an effort to limit this patient's exposure and mitigate transmission in our community. This patient is at least at moderate risk for complications without adequate follow up. This format is felt to be most appropriate for this patient at this time. Physical exam was limited by quality of the video and audio technology used for the visit.   Patient location: *** Provider location: ***    Patient: Tiffany Odom   DOB: [birth year]   47 y.o. Female  MRN: [id number] Visit Date: 09/23/2022  Today's Provider: Carmell Austria, NP  Subjective:    Chief Complaint  Patient presents with   Hypertension    Patient says her reading was 183/111 and says this was right after she had taken her BP medication. Patient says the dizziness has since subsided. Patient says has been having lightheaded and has a headache.    Dizziness    Patient says she had an episode of dizziness yesterday along with elevated 185/104.   Headache   HPI  DIZZINESS RELATED TO HYPERTENSION Patient is physically active at work walking daily, started going to gym to work out to get in better health.  Patient currently taking Irbesartan 300 mg daily, admits to dizzy spells when her BP is elevated.... Uses  wrist cuff. BP 185/104 yesterday. Today 183/111 then took medication, yesterday during day 156/96.  Duration: 2 days Description of symptoms:light headed, and felt room spinning Duration of episode: 5 minutes Dizziness frequency: recurrent with movement Provoking factors: Movement Aggravating factors:  Movement Triggered by rolling over in bed: yes Triggered by bending over: yes Aggravated by head movement: yes Aggravated by exertion, coughing, loud noises: no Recent head injury: no Recent or current viral symptoms: yes had body aches and chills yesterday History of vasovagal episodes: no Nausea: Yes, yesterday morning Vomiting: no Tinnitus: no Hearing loss: no Headache: yes Photophobia/phonophobia: yes Unsteady gait: yes Postural instability: no Diplopia, dysarthria, dysphagia or weakness: no Related to exertion: yes Pallor: no Diaphoresis: yes Dyspnea: no Chest pain: no   HEADACHE, that started this morning Taking Amitriptylan,   Duration: {Blank single:19197::"chronic","days","weeks","months","years"} Onset: {Blank single:19197::"sudden","gradual"} Severity: {Blank single:19197::"mild","moderate","severe","1/10","2/10","3/10","4/10","5/10","6/10","7/10","8/10","9/10","10/10"} Quality: {Blank multiple:19196::"sharp","dull","aching","burning","cramping","ill-defined","itchy","pressure-like","pulling","shooting","sore","stabbing","tender","tearing","throbbing"} Frequency: {Blank single:19197::"constant","intermittent","occasional","rare","every few minutes","a few times a hour","a few times a day","a few times a week","a few times a month","a few times a year"} Location:  Headache duration: Radiation: {Blank single:19197::"yes","no"} Time of day headache occurs:  Alleviating factors:  Aggravating factors:  Headache status at time of visit: {Blank single:19197::"current headache","asymptomatic"} Treatments attempted: Treatments attempted: {Blank  multiple:19196::"none","rest","ice","heat","APAP","ibuprofen","aleve", excedrine","triptans","propranolol","topamax","amitriptyline"}   Aura: {Blank single:19197::"yes","no"} Nausea:  {Blank single:19197::"yes","no"} Vomiting: {Blank single:19197::"yes","no"} Photophobia:  {Blank single:19197::"yes","no"} Phonophobia:  {Blank single:19197::"yes","no"} Effect on social functioning:  {Blank single:19197::"yes","no"} Numbers of missed days of school/work each month:  Confusion:  {Blank single:19197::"yes","no"} Gait disturbance/ataxia:  {Blank single:19197::"yes","no"} Behavioral changes:  {Blank single:19197::"yes","no"} Fevers:  {  Blank single:19197::"yes","no"}   {Show patient history (optional):23778}  Medications: Outpatient Medications Prior to Visit  Medication Sig   atorvastatin (LIPITOR) 80 MG tablet Take 1 tablet (80 mg total) by mouth daily.   Blood Glucose Monitoring Suppl (ONETOUCH VERIO) w/Device KIT Use 1-4 times daily as needed/directed DX E11.9   Cholecalciferol 1.25 MG (50000 UT) TABS Take 1 tablet by mouth once a week.   ezetimibe (ZETIA) 10 MG tablet Take 1 tablet (10 mg total) by mouth daily.   FLUoxetine (PROZAC) 40 MG capsule Take 1 capsule (40 mg total) by mouth daily.   glucose blood test strip Use to check blood sugar 1-2 times daily.   irbesartan (AVAPRO) 300 MG tablet Take 1 tablet (300 mg total) by mouth daily.   Lancets (FREESTYLE) lancets Use to check blood sugar 1-2 times daily.   oxybutynin (DITROPAN XL) 5 MG 24 hr tablet Take 1 tablet (5 mg total) by mouth at bedtime.   No facility-administered medications prior to visit.    {Show previous labs (optional):23779}  Review of Systems  {Show previous labs (optional):23779}    Objective:    There were no vitals taken for this visit. {Show previous vital signs (optional):23777}  Physical Exam       Assessment & Plan:    ***    I discussed the assessment and treatment plan with the patient.  The patient was provided an opportunity to ask questions and all were answered. The patient agreed with the plan and demonstrated an understanding of the instructions.   The patient was advised to call back or seek an in-person evaluation if the symptoms worsen or if the condition fails to improve as anticipated.  I provided *** minutes of non-face-to-face time during this encounter.   Carmell Austria, NP  [facility] [phone] (phone) [phone] (fax)  [place]

## 2022-09-23 NOTE — Assessment & Plan Note (Signed)
Acute, unstable. Patient currently taking Irbesartan 300 mg daily, will add Amlodipine 5 mg daily. Continue checking BP readings at home daily a few hours after taking both medications, and if episode of dizziness. Instructed of DASH diet, reducing the amount of sodium in diet, increase water intake, and continue physical activity at least to 150 minutes weekly. If BP does not improve to goal 130/80 will consider increasing Amlodipine dose to 7.5mg . Follow up in 4 days for in office BP check and overactive bladder.

## 2022-09-23 NOTE — Progress Notes (Signed)
Virtual Visit via Video Note  I connected with Tiffany Odom on 09/23/22 at  8:40 AM EDT by a video enabled telemedicine application and verified that I am speaking with the correct person using two identifiers.  Location: Patient: Home Provider: In office   I discussed the limitations of evaluation and management by telemedicine and the availability of in person appointments. The patient expressed understanding and agreed to proceed.   Chief Complaint  Patient presents with   Hypertension    Patient says her reading was 183/111 and says this was right after she had taken her BP medication. Patient says the dizziness has since subsided. Patient says has been having lightheaded and has a headache. Patient says she usually takes her BP medication at night, but the last two days she has taken it in the morning due to her waking up with elevated BP readings.   Dizziness    Patient says she had an episode of dizziness yesterday along with elevated 185/104.   Headache    History of Present Illness:  DIZZINESS RELATED TO HYPERTENSION Patient currently taking Irbesartan 300 mg daily, admits to dizzy spells when her BP is elevated. The last two days her blood pressure has ranged in 150s-180s/90s-100s. She uses a wrist blood pressure cuff to check her blood pressures. BP 185/104 yesterday before medication and 156/96 after medication. Today 183/111 before taking medication. She does admit to using a lot of salt in her diet. She is beginning to be more physically active by going to the gym to work out and get in better health. She also has an active job.  Duration: 2 days Description of symptoms:light headed, and felt room spinning Duration of episode: 5 minutes Dizziness frequency: recurrent with movement Provoking factors: Movement Aggravating factors:  Movement Triggered by rolling over in bed: yes Triggered by bending over: yes Aggravated by head movement: yes Aggravated by exertion,  coughing, loud noises: no Recent head injury: no Recent or current viral symptoms: yes had body aches and chills yesterday History of vasovagal episodes: no Nausea: Yes, yesterday morning Vomiting: no Tinnitus: no Hearing loss: no Headache: yes started this morning Photophobia/phonophobia: yes Unsteady gait: yes Postural instability: no Diplopia, dysarthria, dysphagia or weakness: no Related to exertion: yes Pallor: no Diaphoresis: yes Dyspnea: no Chest pain: no   Review of Systems  Constitutional:  Positive for diaphoresis. Negative for chills and fever.  HENT:  Negative for tinnitus.   Eyes:  Positive for photophobia. Negative for blurred vision and double vision.  Respiratory:  Negative for shortness of breath.   Cardiovascular:  Negative for chest pain.  Gastrointestinal:  Positive for nausea. Negative for vomiting.  Neurological:  Positive for dizziness and headaches. Negative for weakness.     Assessment and Plan: Problem List Items Addressed This Visit       Cardiovascular and Mediastinum   Hypertension associated with diabetes ([place]) - Primary    Acute, unstable. Patient currently taking Irbesartan 300 mg daily, will add Amlodipine 5 mg daily. Continue checking BP readings at home daily a few hours after taking both medications, and if episode of dizziness. Instructed of DASH diet, reducing the amount of sodium in diet, increase water intake, and continue physical activity at least to 150 minutes weekly. If BP does not improve to goal 130/80 will consider increasing Amlodipine dose to 7.5mg . Follow up in 4 days for in office BP check and overactive bladder.       Relevant Medications   amLODipine (  NORVASC) 5 MG tablet     Other   Dizziness    Acute, stable. Associated with elevated BP readings. Will add Amlodipin 5mg  daily to current regimen. Continue checking BP at home. If symptoms worsen pt instructed to follow up with office during office hours for further  evaluation or go to emergency room if not during office hours. F/u in 4 days. Excused note provided for work.         Follow Up Instructions:  I discussed the assessment and treatment plan with the patient. The patient was provided an opportunity to ask questions and all were answered. The patient agreed with the plan and demonstrated an understanding of the instructions.   The patient was advised to call back or seek an in-person evaluation if the symptoms worsen or if the condition fails to improve as anticipated.  I provided 40 minutes of non-face-to-face time during this encounter.   Tiffany Odom

## 2022-09-23 NOTE — Assessment & Plan Note (Addendum)
Acute, stable. Associated with elevated BP readings. Will add Amlodipin 5mg  daily to current regimen. Continue checking BP at home. If symptoms worsen pt instructed to follow up with office during office hours for further evaluation or go to emergency room if not during office hours. F/u in 4 days. Excused note provided for work.

## 2022-09-23 NOTE — Telephone Encounter (Signed)
Requested Prescriptions  Refused Prescriptions Disp Refills   oxybutynin (DITROPAN-XL) 5 MG 24 hr tablet [Pharmacy Med Name: OXYBUTYNIN CL ER 5 MG TABLET] 90 tablet 1    Sig: TAKE 1 TABLET BY MOUTH EVERYDAY AT BEDTIME     Urology:  Bladder Agents Passed - 09/23/2022  2:33 PM      Passed - Valid encounter within last 12 months    Recent Outpatient Visits           Today Hypertension associated with diabetes [facility])   [place], Doreene Eland, NP   4 weeks ago Type 2 diabetes mellitus with morbid obesity ([place])   [place], [place] T, NP   5 months ago Chronic migraine with aura without status migrainosus, not intractable   [place], [place]ar[place] T, NP   6 months ago Migraine with aura and without status migrainosus, not intractable   [place], [place] T, NP   7 months ago Type 2 diabetes mellitus with morbid obesity ([place])   [place], Barbaraann Faster, NP       Future Appointments             In 4 days Venita Lick, NP [place], [place]   In 2 weeks Hollice Espy, MD [place]

## 2022-09-27 ENCOUNTER — Ambulatory Visit: Payer: BC Managed Care – PPO | Admitting: Nurse Practitioner

## 2022-09-27 DIAGNOSIS — Z419 Encounter for procedure for purposes other than remedying health state, unspecified: Secondary | ICD-10-CM | POA: Diagnosis not present

## 2022-10-13 ENCOUNTER — Ambulatory Visit: Payer: BC Managed Care – PPO | Admitting: Urology

## 2022-10-18 ENCOUNTER — Ambulatory Visit: Payer: BC Managed Care – PPO | Admitting: Urology

## 2022-10-26 IMAGING — DX DG CHEST 2V
2 series · 2 of 2 positions shown · non-contrast
Comparison: No priors.

CLINICAL DATA: 45-year-old female with history of ongoing cough for
1 month.

EXAM:
CHEST - 2 VIEW

[chest pa]
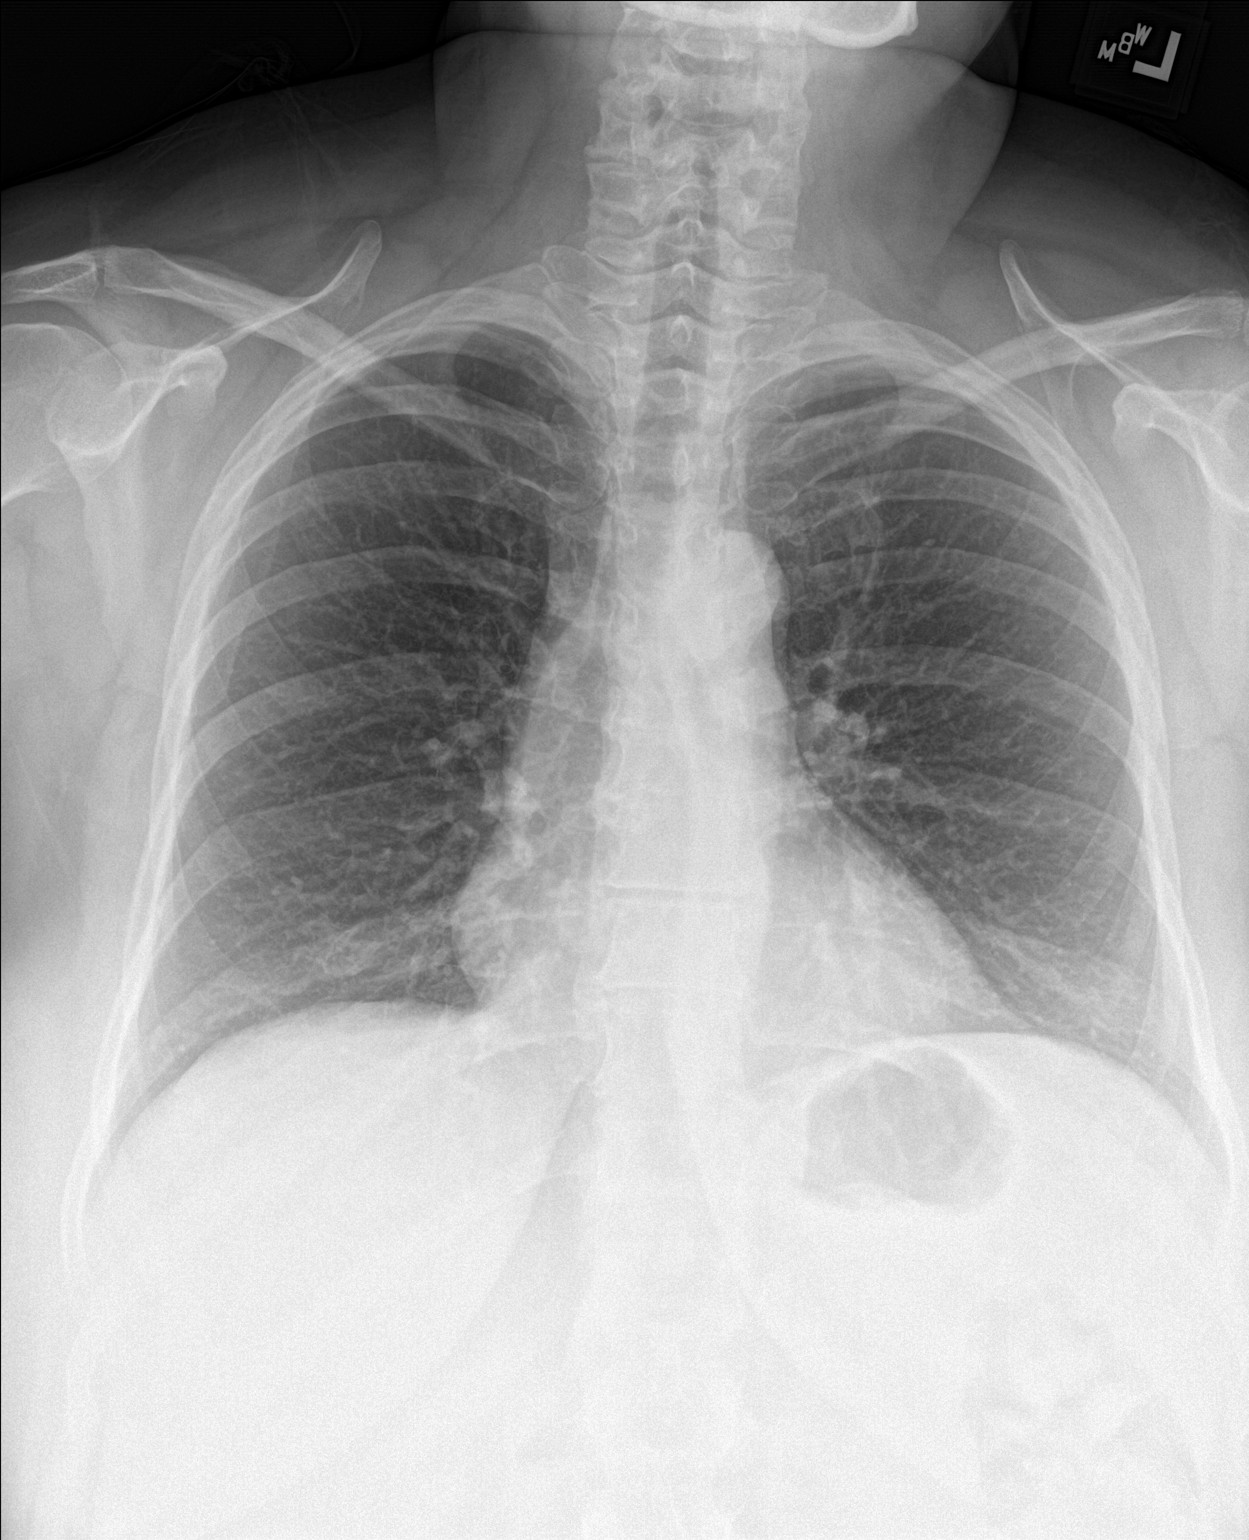

[chest lat]
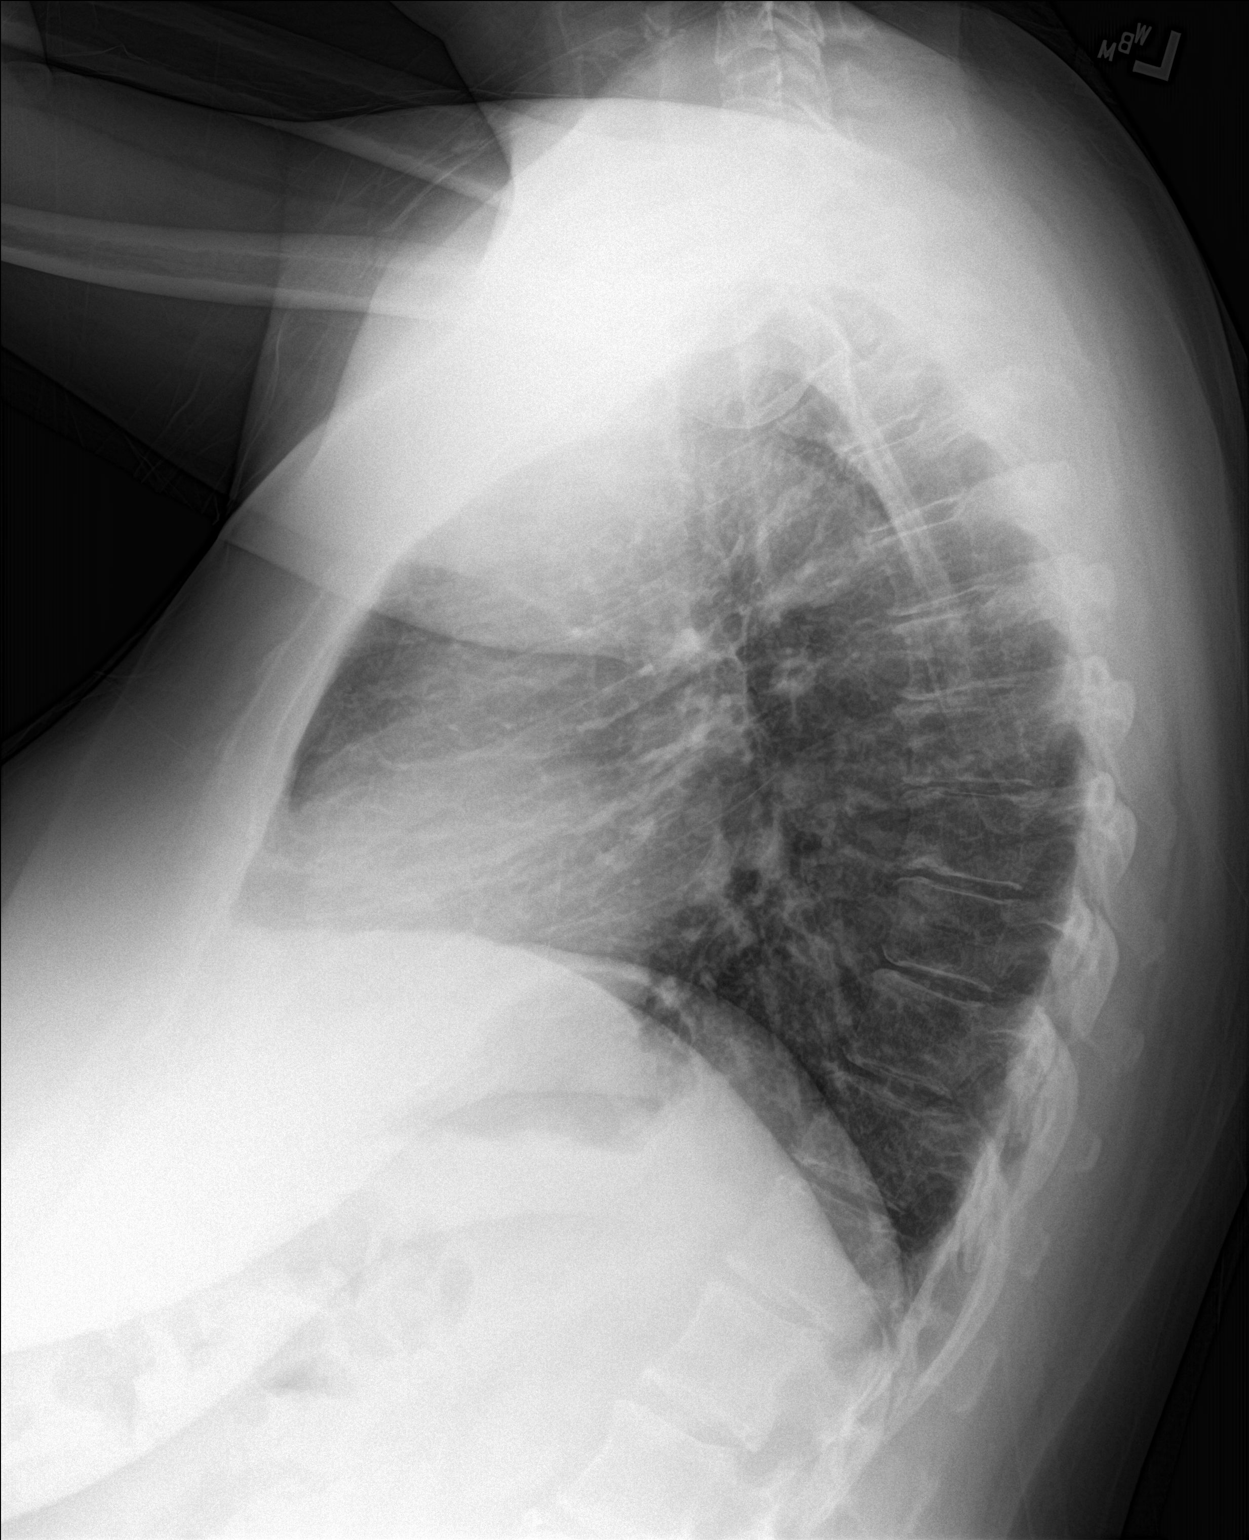

[2 of 2 positions shown; findings below may reference images not displayed]

FINDINGS: Lung volumes are normal. No consolidative airspace disease. No
pleural effusions. No pneumothorax. No pulmonary nodule or mass
noted. Pulmonary vasculature and the cardiomediastinal silhouette
are within normal limits.
IMPRESSION: No radiographic evidence of acute cardiopulmonary disease.

## 2022-10-27 DIAGNOSIS — Z419 Encounter for procedure for purposes other than remedying health state, unspecified: Secondary | ICD-10-CM | POA: Diagnosis not present

## 2022-11-27 ENCOUNTER — Other Ambulatory Visit: Payer: Self-pay | Admitting: Nurse Practitioner

## 2022-11-27 DIAGNOSIS — Z419 Encounter for procedure for purposes other than remedying health state, unspecified: Secondary | ICD-10-CM | POA: Diagnosis not present

## 2022-11-29 NOTE — Telephone Encounter (Signed)
Requested Prescriptions  Pending Prescriptions Disp Refills   irbesartan (AVAPRO) 300 MG tablet [Pharmacy Med Name: IRBESARTAN 300 MG TABLET] 90 tablet 0    Sig: TAKE 1 TABLET BY MOUTH EVERY DAY     Cardiovascular:  Angiotensin Receptor Blockers Failed - 11/27/2022  8:39 AM      Failed - Last BP in normal range    BP Readings from Last 1 Encounters:  09/23/22 (!) 183/111         Passed - Cr in normal range and within 180 days    Creatinine, Ser  Date Value Ref Range Status  08/25/2022 0.78 0.57 - 1.00 mg/dL Final         Passed - K in normal range and within 180 days    Potassium  Date Value Ref Range Status  08/25/2022 4.3 3.5 - 5.2 mmol/L Final         Passed - Patient is not pregnant      Passed - Valid encounter within last 6 months    Recent Outpatient Visits           2 months ago Hypertension associated with diabetes (HCC)   [place] [facility] [place], Sherran Needs, NP   3 months ago Type 2 diabetes mellitus with morbid obesity (HCC)   [place] [facility] [place], [place] T, NP   7 months ago Chronic migraine with aura without status migrainosus, not intractable   [place] [facility] [place], [place] T, NP   8 months ago Migraine with aura and without status migrainosus, not intractable   [place] [facility] [place], [place] T, NP   9 months ago Type 2 diabetes mellitus with morbid obesity (HCC)   [place] [facility] [place], Dorie Rank, NP       Future Appointments             In 2 weeks MacDiarmid, Lorin Picket, MD [facility]

## 2022-12-13 ENCOUNTER — Ambulatory Visit (INDEPENDENT_AMBULATORY_CARE_PROVIDER_SITE_OTHER): Payer: BC Managed Care – PPO | Admitting: Urology

## 2022-12-13 ENCOUNTER — Encounter: Payer: Self-pay | Admitting: Urology

## 2022-12-13 VITALS — BP 161/100 | HR 90 | Ht 60.0 in | Wt 193.0 lb

## 2022-12-13 DIAGNOSIS — N3946 Mixed incontinence: Secondary | ICD-10-CM

## 2022-12-13 DIAGNOSIS — N3281 Overactive bladder: Secondary | ICD-10-CM | POA: Diagnosis not present

## 2022-12-13 LAB — URINALYSIS, COMPLETE
Bilirubin, UA: NEGATIVE
Glucose, UA: NEGATIVE
Ketones, UA: NEGATIVE
Leukocytes,UA: NEGATIVE
Nitrite, UA: NEGATIVE
Protein,UA: NEGATIVE
RBC, UA: NEGATIVE
Specific Gravity, UA: 1.02 (ref 1.005–1.030)
Urobilinogen, Ur: 0.2 mg/dL (ref 0.2–1.0)
pH, UA: 7 (ref 5.0–7.5)

## 2022-12-13 LAB — MICROSCOPIC EXAMINATION

## 2022-12-13 NOTE — Progress Notes (Signed)
12/13/2022 3:14 PM   Tiffany Odom [birth year] [id number]  Referring provider: Marjie Skiff, NP [address] [place],  [place] [address]  Chief Complaint  Patient presents with   Follow-up   New Patient (Initial Visit)    HPI: Was consulted to assess the patient's urinary incontinence.  She leaks with coughing sneezing sometimes with bending and lifting.  Sometimes incontinence with lifting is significant.  She has urge incontinence.  Both are significant.  She has no bedwetting.  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  She wears 2 or 3 pads a day with varying amount of leakage sometimes depending on activity  She can void every 60 to 90 minutes and other times every 2-3 hours.  Sometimes she gets up once a night.  She voids with a strong flow  She has had a hysterectomy.  Oxybutynin caused dizziness but helped up to 75% in the past.  Bowel movements normal.  No neurologic issues.  No history of bladder surgery or bladder infections   PMH: Past Medical History:  Diagnosis Date   Depression    Diabetes (HCC)    Hyperlipemia    Hypertension     Surgical History: Past Surgical History:  Procedure Laterality Date   BILATERAL CARPAL TUNNEL RELEASE     CESAREAN SECTION     CHOLECYSTECTOMY     TOTAL ABDOMINAL HYSTERECTOMY W/ BILATERAL SALPINGOOPHORECTOMY      Home Medications:  Allergies as of 12/13/2022       Reactions   Oxycodone-acetaminophen Rash   Overly sedated on Percocet at time of lithotripsy 2010   Sulfamethoxazole-trimethoprim Rash   Drug rash January 2017   Lisinopril Cough   Rosuvastatin Other (See Comments)   Cyclobenzaprine Rash   "rasied Blood pressure"         Medication List        Accurate as of December 13, 2022  3:14 PM. If you have any questions, ask your nurse or doctor.          amLODipine 5 MG tablet Commonly known as: NORVASC Take 1 tablet (5 mg total) by mouth daily.   atorvastatin 80 MG tablet Commonly known as:  LIPITOR Take 1 tablet (80 mg total) by mouth daily.   Cholecalciferol 1.25 MG (50000 UT) Tabs Take 1 tablet by mouth once a week.   ezetimibe 10 MG tablet Commonly known as: ZETIA Take 1 tablet (10 mg total) by mouth daily.   FLUoxetine 40 MG capsule Commonly known as: PROZAC Take 1 capsule (40 mg total) by mouth daily.   freestyle lancets Use to check blood sugar 1-2 times daily.   glucose blood test strip Use to check blood sugar 1-2 times daily.   irbesartan 300 MG tablet Commonly known as: AVAPRO TAKE 1 TABLET BY MOUTH EVERY DAY   OneTouch Verio w/Device Kit Use 1-4 times daily as needed/directed DX E11.9   oxybutynin 5 MG 24 hr tablet Commonly known as: Ditropan XL Take 1 tablet (5 mg total) by mouth at bedtime.        Allergies:  Allergies  Allergen Reactions   Oxycodone-Acetaminophen Rash    Overly sedated on Percocet at time of lithotripsy 2010   Sulfamethoxazole-Trimethoprim Rash    Drug rash January 2017   Lisinopril Cough   Rosuvastatin Other (See Comments)   Cyclobenzaprine Rash    "rasied Blood pressure"     Family History: Family History  Problem Relation Age of Onset   Diabetes Mother  ADD / ADHD Mother    Heart Problems Mother    Sleep apnea Mother    Diabetes Father    Heart Problems Father    Healthy Sister    Healthy Sister    Healthy Sister    Healthy Sister    Healthy Sister    Healthy Brother    Heart Problems Maternal Grandmother    Diabetes Maternal Grandmother    Heart Problems Paternal Grandmother    Diabetes Paternal Grandmother     Social History:  reports that she has never smoked. She has never used smokeless tobacco. She reports current alcohol use. She reports that she does not use drugs.  ROS:                                        Physical Exam: There were no vitals taken for this visit.  Constitutional:  Alert and oriented, No acute distress. HEENT: [place] AT, moist mucus membranes.   Trachea midline, no masses. Cardiovascular: No clubbing, cyanosis, or edema. Respiratory: Normal respiratory effort, no increased work of breathing. GI: Abdomen is soft, nontender, nondistended, no abdominal masses GU: Mild grade 2 hypermobility of the bladder neck and leaked a few drops.  No cystocele.  No rectocele Skin: No rashes, bruises or suspicious lesions. Lymph: No cervical or inguinal adenopathy. Neurologic: Grossly intact, no focal deficits, moving all 4 extremities. Psychiatric: Normal mood and affect.  Laboratory Data: Lab Results  Component Value Date   WBC 8.6 08/25/2022   HGB 13.3 08/25/2022   HCT 41.8 08/25/2022   MCV 94 08/25/2022   PLT 363 08/25/2022    Lab Results  Component Value Date   CREATININE 0.78 08/25/2022    No results found for: "PSA"  No results found for: "TESTOSTERONE"  Lab Results  Component Value Date   HGBA1C 6.7 (H) 08/25/2022    Urinalysis    Component Value Date/Time   COLORURINE YELLOW (A) 04/15/2022 0228   APPEARANCEUR HAZY (A) 04/15/2022 0228   LABSPEC 1.020 04/15/2022 0228   PHURINE 7.0 04/15/2022 0228   GLUCOSEU NEGATIVE 04/15/2022 0228   HGBUR NEGATIVE 04/15/2022 0228   BILIRUBINUR NEGATIVE 04/15/2022 0228   KETONESUR 5 (A) 04/15/2022 0228   PROTEINUR 30 (A) 04/15/2022 0228   NITRITE NEGATIVE 04/15/2022 0228   LEUKOCYTESUR SMALL (A) 04/15/2022 0228    Pertinent Imaging: Urine reviewed and sent for culture.  Chart reviewed  Assessment & Plan: Patient has mixed incontinence.  She has frequency and milder nocturia.  She will return for urodynamics and cystoscopy and proceed accordingly.  Call if culture positive.  1. OAB (overactive bladder)  - Urinalysis, Complete - BLADDER SCAN AMB NON-IMAGING   No follow-ups on file.  Martina Sinner, MD  [facility] [address], [address] [place], [place] [address] [phone]

## 2022-12-16 LAB — CULTURE, URINE COMPREHENSIVE

## 2022-12-27 DIAGNOSIS — Z419 Encounter for procedure for purposes other than remedying health state, unspecified: Secondary | ICD-10-CM | POA: Diagnosis not present

## 2023-01-01 ENCOUNTER — Other Ambulatory Visit: Payer: Self-pay | Admitting: Family Medicine

## 2023-01-01 DIAGNOSIS — E1159 Type 2 diabetes mellitus with other circulatory complications: Secondary | ICD-10-CM

## 2023-01-03 NOTE — Telephone Encounter (Signed)
Requested Prescriptions  Pending Prescriptions Disp Refills   amLODipine (NORVASC) 5 MG tablet [Pharmacy Med Name: AMLODIPINE BESYLATE 5 MG TAB] 90 tablet 0    Sig: TAKE 1 TABLET (5 MG TOTAL) BY MOUTH DAILY.     Cardiovascular: Calcium Channel Blockers 2 Failed - 01/01/2023  9:22 AM      Failed - Last BP in normal range    BP Readings from Last 1 Encounters:  12/13/22 (!) 161/100         Passed - Last Heart Rate in normal range    Pulse Readings from Last 1 Encounters:  12/13/22 90         Passed - Valid encounter within last 6 months    Recent Outpatient Visits           3 months ago Hypertension associated with diabetes [facility])   [place] [facility] [place], Sherran Needs, NP   4 months ago Type 2 diabetes mellitus with morbid obesity (HCC)   [place] [facility] [place], [place] T, NP   8 months ago Chronic migraine with aura without status migrainosus, not intractable   [place] [facility] [place], [place] T, NP   9 months ago Migraine with aura and without status migrainosus, not intractable   [place] [facility] [place], [place] T, NP   10 months ago Type 2 diabetes mellitus with morbid obesity (HCC)    [facility] [place], Dorie Rank, NP

## 2023-01-17 DIAGNOSIS — N3946 Mixed incontinence: Secondary | ICD-10-CM | POA: Diagnosis not present

## 2023-01-24 ENCOUNTER — Ambulatory Visit: Payer: BC Managed Care – PPO | Admitting: Urology

## 2023-01-24 ENCOUNTER — Encounter: Payer: Self-pay | Admitting: Urology

## 2023-01-24 VITALS — BP 135/79 | HR 77 | Ht 61.0 in | Wt 185.0 lb

## 2023-01-24 DIAGNOSIS — N3281 Overactive bladder: Secondary | ICD-10-CM | POA: Diagnosis not present

## 2023-01-24 LAB — MICROSCOPIC EXAMINATION: Epithelial Cells (non renal): >10 /hpf — AB (ref 0–10)

## 2023-01-24 LAB — URINALYSIS, COMPLETE
Bilirubin, UA: NEGATIVE
Glucose, UA: NEGATIVE
Ketones, UA: NEGATIVE
Leukocytes,UA: NEGATIVE
Nitrite, UA: NEGATIVE
Protein,UA: NEGATIVE
RBC, UA: NEGATIVE
Specific Gravity, UA: 1.02 (ref 1.005–1.030)
Urobilinogen, Ur: 1 mg/dL (ref 0.2–1.0)
pH, UA: 6 (ref 5.0–7.5)

## 2023-01-24 MED ORDER — GEMTESA 75 MG PO TABS: 75.0000 mg | ORAL_TABLET | Freq: Every day | ORAL | 0 refills | Status: DC

## 2023-01-24 NOTE — Progress Notes (Signed)
01/24/2023 2:29 PM   Tiffany Odom 10-08-1975 [id number]  Referring provider: Marjie Skiff, NP [address] [place],  [place] [address]  Chief Complaint  Patient presents with   Cysto    HPI: I was consulted to assess the patient's urinary incontinence.  She leaks with coughing sneezing sometimes with bending and lifting.  Sometimes incontinence with lifting is significant.  She has urge incontinence.  Both are significant.  She has no bedwetting.  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  She wears 2 or 3 pads a day with varying amount of leakage sometimes depending on activity   She can void every 60 to 90 minutes and other times every 2-3 hours.  Sometimes she gets up once a night.  She voids with a strong flow   She has had a hysterectomy.  Oxybutynin caused dizziness but helped up to 75% in the past.    Mild grade 2 hypermobility of the bladder neck and leaked a few drops. No cystocele. No rectocele   Patient has mixed incontinence.  She has frequency and milder nocturia.  She will return for urodynamics and cystoscopy and proceed accordingly.  Call if culture positive.   Today Frequency stable.  Incontinence stable.  Last culture negative On urodynamics patient voided 129 mL with a maximal of 8 mL/s.  Residual was a few milliliters.  Maximum bladder capacity was 200 mL.  She increased bladder sensation.  Bladder was unstable reaching pressures of 24 cm of water.  The overactivity was also triggered and impressive with severe urge incontinence.  Many leak point pressures were assessed.  She did leak once with a Valsalva leak point pressure 200 mL or 33 cm of water with mild leakage.  During voiding she voided 93 mL with a maximal flow of 8 mL/s.  Maximum voiding pressure of 14 cm water.  It was well-sustained.  Residual 0 mL.  EMG activity was normal.  Bladder neck descent at less than a centimeter.  The details of the urodynamics are signed and dictated  Patient had mild grade 2  hypermobility the bladder neck and did leak a few drops after cystoscopy.  She had 2 very small suburethral swellings within normal limits.  I do not think she has a diverticulum.  Cystoscopy: Patient underwent flexible cystoscopy.  Bladder mucosa and trigone were normal.  No cystitis.  No carcinoma.  Very well-tolerated.  No diverticulum noted       PMH: Past Medical History:  Diagnosis Date   Depression    Diabetes (HCC)    Hyperlipemia    Hypertension     Surgical History: Past Surgical History:  Procedure Laterality Date   BILATERAL CARPAL TUNNEL RELEASE     CESAREAN SECTION     CHOLECYSTECTOMY     TOTAL ABDOMINAL HYSTERECTOMY W/ BILATERAL SALPINGOOPHORECTOMY      Home Medications:  Allergies as of 01/24/2023       Reactions   Oxycodone-acetaminophen Rash   Overly sedated on Percocet at time of lithotripsy 2010   Sulfamethoxazole-trimethoprim Rash   Drug rash January 2017   Lisinopril Cough   Rosuvastatin Other (See Comments)   Cyclobenzaprine Rash   "rasied Blood pressure"         Medication List        Accurate as of January 24, 2023  2:29 PM. If you have any questions, ask your nurse or doctor.          amLODipine 5 MG tablet Commonly  known as: NORVASC TAKE 1 TABLET (5 MG TOTAL) BY MOUTH DAILY.   atorvastatin 80 MG tablet Commonly known as: LIPITOR Take 1 tablet (80 mg total) by mouth daily.   Cholecalciferol 1.25 MG (50000 UT) Tabs Take 1 tablet by mouth once a week.   ezetimibe 10 MG tablet Commonly known as: ZETIA Take 1 tablet (10 mg total) by mouth daily.   FLUoxetine 40 MG capsule Commonly known as: PROZAC Take 1 capsule (40 mg total) by mouth daily.   freestyle lancets Use to check blood sugar 1-2 times daily.   glucose blood test strip Use to check blood sugar 1-2 times daily.   irbesartan 300 MG tablet Commonly known as: AVAPRO TAKE 1 TABLET BY MOUTH EVERY DAY   OneTouch Verio w/Device Kit Use 1-4 times daily as  needed/directed DX E11.9        Allergies:  Allergies  Allergen Reactions   Oxycodone-Acetaminophen Rash    Overly sedated on Percocet at time of lithotripsy 2010   Sulfamethoxazole-Trimethoprim Rash    Drug rash January 2017   Lisinopril Cough   Rosuvastatin Other (See Comments)   Cyclobenzaprine Rash    "rasied Blood pressure"     Family History: Family History  Problem Relation Age of Onset   Diabetes Mother    ADD / ADHD Mother    Heart Problems Mother    Sleep apnea Mother    Diabetes Father    Heart Problems Father    Healthy Sister    Healthy Sister    Healthy Sister    Healthy Sister    Healthy Sister    Healthy Brother    Heart Problems Maternal Grandmother    Diabetes Maternal Grandmother    Heart Problems Paternal Grandmother    Diabetes Paternal Grandmother     Social History:  reports that she has never smoked. She has never been exposed to tobacco smoke. She has never used smokeless tobacco. She reports current alcohol use. She reports that she does not use drugs.  ROS:                                        Physical Exam: BP 135/79   Pulse 77   Ht 5\' 1"  (1.549 m)   Wt 83.9 kg   BMI 34.96 kg/m   Constitutional:  Alert and oriented, No acute distress. HEENT: [place] AT, moist mucus membranes.  Trachea midline, no masses.   Laboratory Data: Lab Results  Component Value Date   WBC 8.6 08/25/2022   HGB 13.3 08/25/2022   HCT 41.8 08/25/2022   MCV 94 08/25/2022   PLT 363 08/25/2022    Lab Results  Component Value Date   CREATININE 0.78 08/25/2022    No results found for: "PSA"  No results found for: "TESTOSTERONE"  Lab Results  Component Value Date   HGBA1C 6.7 (H) 08/25/2022    Urinalysis    Component Value Date/Time   COLORURINE YELLOW (A) 04/15/2022 0228   APPEARANCEUR Clear 12/13/2022 1516   LABSPEC 1.020 04/15/2022 0228   PHURINE 7.0 04/15/2022 0228   GLUCOSEU Negative 12/13/2022 1516   HGBUR  NEGATIVE 04/15/2022 0228   BILIRUBINUR Negative 12/13/2022 1516   KETONESUR 5 (A) 04/15/2022 0228   PROTEINUR Negative 12/13/2022 1516   PROTEINUR 30 (A) 04/15/2022 0228   NITRITE Negative 12/13/2022 1516   NITRITE NEGATIVE 04/15/2022 0228   LEUKOCYTESUR Negative  12/13/2022 1516   LEUKOCYTESUR SMALL (A) 04/15/2022 0228    Pertinent Imaging:   Assessment & Plan: Patient has mixed incontinence.  She has significant overactive bladder.  75% overactive bladder and 25% stress incontinence. She has failed oxybutynin.  I will try to help her with medical and behavioral therapy.  Physical therapy offered but she thinks she has done it in the past and failed.  If she does not reach her treatment goal she could be offered a bulking agent or sling.  If she could live with her stress incontinence she could be offered third line therapies.  She may be at higher risk of persistent or worsening overactive bladder symptoms based upon urodynamics.  Reassess in 6 weeks on Gemtesa samples and prescription.  1. OAB (overactive bladder)  - Urinalysis, Complete   No follow-ups on file.  Martina Sinner, MD  [facility] [address], [address] [place], [place] [address] [phone]

## 2023-01-27 DIAGNOSIS — Z419 Encounter for procedure for purposes other than remedying health state, unspecified: Secondary | ICD-10-CM | POA: Diagnosis not present

## 2023-02-27 DIAGNOSIS — Z419 Encounter for procedure for purposes other than remedying health state, unspecified: Secondary | ICD-10-CM | POA: Diagnosis not present

## 2023-03-03 ENCOUNTER — Encounter: Payer: Self-pay | Admitting: Urology

## 2023-03-21 ENCOUNTER — Ambulatory Visit: Payer: BC Managed Care – PPO | Admitting: Urology

## 2023-04-04 DIAGNOSIS — F4322 Adjustment disorder with anxiety: Secondary | ICD-10-CM | POA: Diagnosis not present

## 2023-04-11 ENCOUNTER — Ambulatory Visit (INDEPENDENT_AMBULATORY_CARE_PROVIDER_SITE_OTHER): Payer: BC Managed Care – PPO | Admitting: Urology

## 2023-04-11 DIAGNOSIS — N3946 Mixed incontinence: Secondary | ICD-10-CM

## 2023-04-11 DIAGNOSIS — N3281 Overactive bladder: Secondary | ICD-10-CM

## 2023-04-11 DIAGNOSIS — F4322 Adjustment disorder with anxiety: Secondary | ICD-10-CM | POA: Diagnosis not present

## 2023-04-11 LAB — URINALYSIS, COMPLETE
Bilirubin, UA: NEGATIVE
Glucose, UA: NEGATIVE
Ketones, UA: NEGATIVE
Leukocytes,UA: NEGATIVE
Nitrite, UA: NEGATIVE
Protein,UA: NEGATIVE
RBC, UA: NEGATIVE
Specific Gravity, UA: 1.02 (ref 1.005–1.030)
Urobilinogen, Ur: 1 mg/dL (ref 0.2–1.0)
pH, UA: 7 (ref 5.0–7.5)

## 2023-04-11 LAB — MICROSCOPIC EXAMINATION: Epithelial Cells (non renal): >10 /[HPF] — AB (ref 0–10)

## 2023-04-11 MED ORDER — MIRABEGRON ER 50 MG PO TB24
50.0000 mg | ORAL_TABLET | Freq: Every day | ORAL | 11 refills | Status: DC
Start: 2023-04-11 — End: 2023-04-15

## 2023-04-11 NOTE — Progress Notes (Signed)
04/11/2023 10:38 AM   Tiffany Odom [birth year] [id number]  Referring provider: Marjie Skiff, NP [address] [place],  [place] [address]  Chief Complaint  Patient presents with   Follow-up    HPI: I was consulted to assess the patient's urinary incontinence.  She leaks with coughing sneezing sometimes with bending and lifting.  Sometimes incontinence with lifting is significant.  She has urge incontinence.  Both are significant.  She has no bedwetting.  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  She wears 2 or 3 pads a day with varying amount of leakage sometimes depending on activity   She can void every 60 to 90 minutes and other times every 2-3 hours.  Sometimes she gets up once a night.  She voids with a strong flow   She has had a hysterectomy.  Oxybutynin caused dizziness but helped up to 75% in the past.     Mild grade 2 hypermobility of the bladder neck and leaked a few drops. No cystocele. No rectocele    Patient has mixed incontinence.  She has frequency and milder nocturia.  She will return for urodynamics and cystoscopy and proceed accordingly.  Call if culture positive.    Last culture negative On urodynamics patient voided 129 mL with a maximal of 8 mL/s.  Residual was a few milliliters.  Maximum bladder capacity was 200 mL.  She increased bladder sensation.  Bladder was unstable reaching pressures of 24 cm of water.  The overactivity was also triggered and impressive with severe urge incontinence.  Many leak point pressures were assessed.  She did leak once with a Valsalva leak point pressure 200 mL or 33 cm of water with mild leakage.  During voiding she voided 93 mL with a maximal flow of 8 mL/s.  Maximum voiding pressure of 14 cm water.  It was well-sustained.  Residual 0 mL.  EMG activity was normal.  Bladder neck descent at less than a centimeter.     Patient had mild grade 2 hypermobility the bladder neck and did leak a few drops after cystoscopy.  She had 2 very  small suburethral swellings within normal limits.  I do not think she has a diverticulum.   Cystoscopy: Patient underwent flexible cystoscopy.  Bladder mucosa and trigone were normal.  No cystitis.  No carcinoma.  Very well-tolerated.  No diverticulum noted     Patient has mixed incontinence.  She has significant overactive bladder.  75% overactive bladder and 25% stress incontinence. She has failed oxybutynin.  I will try to help her with medical and behavioral therapy.  Physical therapy offered but she thinks she has done it in the past and failed.  If she does not reach her treatment goal she could be offered a bulking agent or sling.  If she could live with her stress incontinence she could be offered third line therapies.  She may be at higher risk of persistent or worsening overactive bladder symptoms based upon urodynamics.  Reassess in 6 weeks on Gemtesa samples and prescription.   Today Having less high-volume leakage.  She says she is less than 20% improved.  She says primarily she can leak with almost no warning.  Clinically not infected but sent urine for culture.  Frequency stable     PMH: Past Medical History:  Diagnosis Date   Depression    Diabetes (HCC)    Hyperlipemia    Hypertension     Surgical History: Past Surgical History:  Procedure  Laterality Date   BILATERAL CARPAL TUNNEL RELEASE     CESAREAN SECTION     CHOLECYSTECTOMY     TOTAL ABDOMINAL HYSTERECTOMY W/ BILATERAL SALPINGOOPHORECTOMY      Home Medications:  Allergies as of 04/11/2023       Reactions   Oxycodone-acetaminophen Rash   Overly sedated on Percocet at time of lithotripsy 2010   Sulfamethoxazole-trimethoprim Rash   Drug rash January 2017   Lisinopril Cough   Rosuvastatin Other (See Comments)   Cyclobenzaprine Rash   "rasied Blood pressure"         Medication List        Accurate as of April 11, 2023 10:38 AM. If you have any questions, ask your nurse or doctor.           amLODipine 5 MG tablet Commonly known as: NORVASC TAKE 1 TABLET (5 MG TOTAL) BY MOUTH DAILY.   atorvastatin 80 MG tablet Commonly known as: LIPITOR Take 1 tablet (80 mg total) by mouth daily.   Cholecalciferol 1.25 MG (50000 UT) Tabs Take 1 tablet by mouth once a week.   ezetimibe 10 MG tablet Commonly known as: ZETIA Take 1 tablet (10 mg total) by mouth daily.   FLUoxetine 40 MG capsule Commonly known as: PROZAC Take 1 capsule (40 mg total) by mouth daily.   freestyle lancets Use to check blood sugar 1-2 times daily.   Gemtesa 75 MG Tabs Generic drug: Vibegron Take 1 tablet (75 mg total) by mouth daily.   glucose blood test strip Use to check blood sugar 1-2 times daily.   irbesartan 300 MG tablet Commonly known as: AVAPRO TAKE 1 TABLET BY MOUTH EVERY DAY   OneTouch Verio w/Device Kit Use 1-4 times daily as needed/directed DX E11.9        Allergies:  Allergies  Allergen Reactions   Oxycodone-Acetaminophen Rash    Overly sedated on Percocet at time of lithotripsy 2010   Sulfamethoxazole-Trimethoprim Rash    Drug rash January 2017   Lisinopril Cough   Rosuvastatin Other (See Comments)   Cyclobenzaprine Rash    "rasied Blood pressure"     Family History: Family History  Problem Relation Age of Onset   Diabetes Mother    ADD / ADHD Mother    Heart Problems Mother    Sleep apnea Mother    Diabetes Father    Heart Problems Father    Healthy Sister    Healthy Sister    Healthy Sister    Healthy Sister    Healthy Sister    Healthy Brother    Heart Problems Maternal Grandmother    Diabetes Maternal Grandmother    Heart Problems Paternal Grandmother    Diabetes Paternal Grandmother     Social History:  reports that she has never smoked. She has never been exposed to tobacco smoke. She has never used smokeless tobacco. She reports current alcohol use. She reports that she does not use drugs.  ROS:                                         Physical Exam: There were no vitals taken for this visit.  Constitutional:  Alert and oriented, No acute distress. HEENT: [place] AT, moist mucus membranes.  Trachea midline, no masses.  Laboratory Data: Lab Results  Component Value Date   WBC 8.6 08/25/2022   HGB 13.3 08/25/2022   HCT  41.8 08/25/2022   MCV 94 08/25/2022   PLT 363 08/25/2022    Lab Results  Component Value Date   CREATININE 0.78 08/25/2022    No results found for: "PSA"  No results found for: "TESTOSTERONE"  Lab Results  Component Value Date   HGBA1C 6.7 (H) 08/25/2022    Urinalysis    Component Value Date/Time   COLORURINE YELLOW (A) 04/15/2022 0228   APPEARANCEUR Clear 01/24/2023 1413   LABSPEC 1.020 04/15/2022 0228   PHURINE 7.0 04/15/2022 0228   GLUCOSEU Negative 01/24/2023 1413   HGBUR NEGATIVE 04/15/2022 0228   BILIRUBINUR Negative 01/24/2023 1413   KETONESUR 5 (A) 04/15/2022 0228   PROTEINUR Negative 01/24/2023 1413   PROTEINUR 30 (A) 04/15/2022 0228   NITRITE Negative 01/24/2023 1413   NITRITE NEGATIVE 04/15/2022 0228   LEUKOCYTESUR Negative 01/24/2023 1413   LEUKOCYTESUR SMALL (A) 04/15/2022 0228    Pertinent Imaging:   Assessment & Plan: Reassess in 6 weeks on Myrbetriq 50 mg samples and prescription.  Reassess severity of each symptom and quantitate them in treatment goals and discuss refractory overactive bladder pathway versus the stress incontinence pathway.  Bulking agent may be better option then sling due to refractory overactive bladder.  Call if culture positive  1. OAB (overactive bladder)  - Urinalysis, Complete   No follow-ups on file.  Martina Sinner, MD  [facility] [address], [address] [place], [place] [address] [phone]

## 2023-04-13 DIAGNOSIS — F4322 Adjustment disorder with anxiety: Secondary | ICD-10-CM | POA: Diagnosis not present

## 2023-04-13 LAB — CULTURE, URINE COMPREHENSIVE

## 2023-04-15 MED ORDER — SOLIFENACIN SUCCINATE 5 MG PO TABS: 5.0000 mg | ORAL_TABLET | Freq: Every day | ORAL | 11 refills | Status: DC

## 2023-04-15 MED ORDER — OXYBUTYNIN CHLORIDE ER 10 MG PO TB24: 10.0000 mg | ORAL_TABLET | Freq: Every day | ORAL | 11 refills | Status: DC

## 2023-04-15 NOTE — Telephone Encounter (Signed)
rx sent to pharmacy by e-script  

## 2023-04-15 NOTE — Addendum Note (Signed)
Addended by: Sueanne Margarita on: 04/15/2023 01:55 PM   Modules accepted: Orders

## 2023-04-15 NOTE — Telephone Encounter (Signed)
Spoke with patient and advised results rx sent to pharmacy by e-script  

## 2023-04-15 NOTE — Addendum Note (Signed)
Addended by: Sueanne Margarita on: 04/15/2023 11:40 AM   Modules accepted: Orders

## 2023-04-15 NOTE — Telephone Encounter (Signed)
Pt had tried myrbetriq and gemtesa, what else can she try?

## 2023-04-16 IMAGING — CT CT HEAD W/O CM
3 of 4 series · 13 of 47 positions shown, 15 images · non-contrast
Comparison: None

CLINICAL DATA: A 46-year-old female presents with syncope and fall.



[Series 2: axial st head 5.00 ax · axial · 0.33mm/px · z∈[-547,-427]mm · 7 of 33 slices shown, 9 images]
[im 5/33  brain]
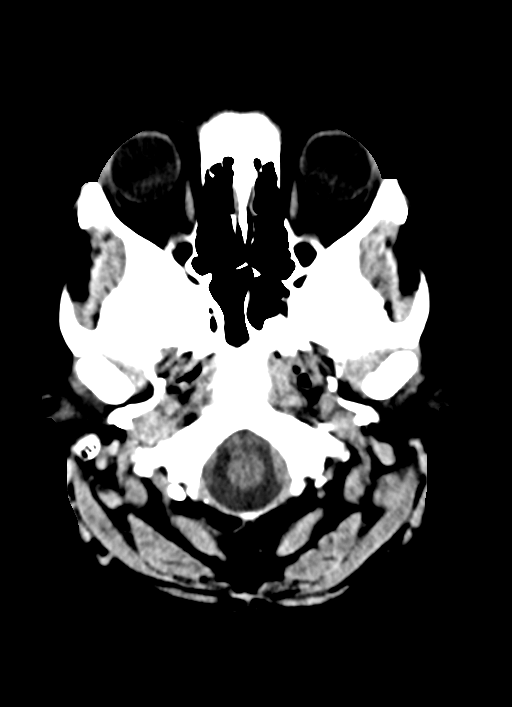
[im 5/33  bone]
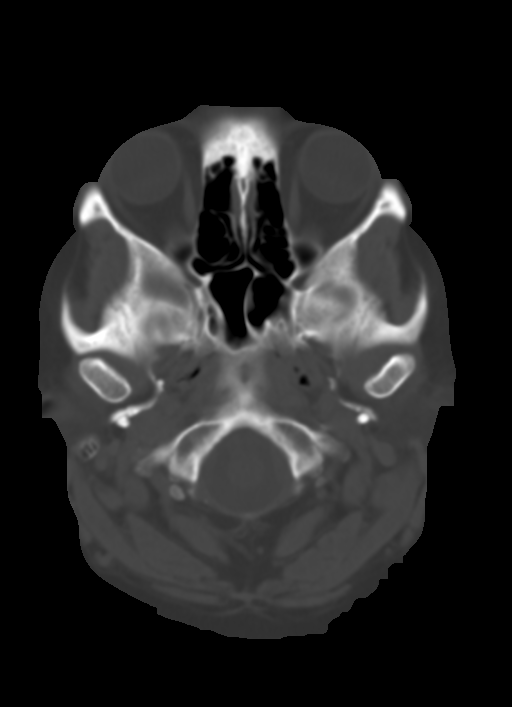
[im 9/33  brain]
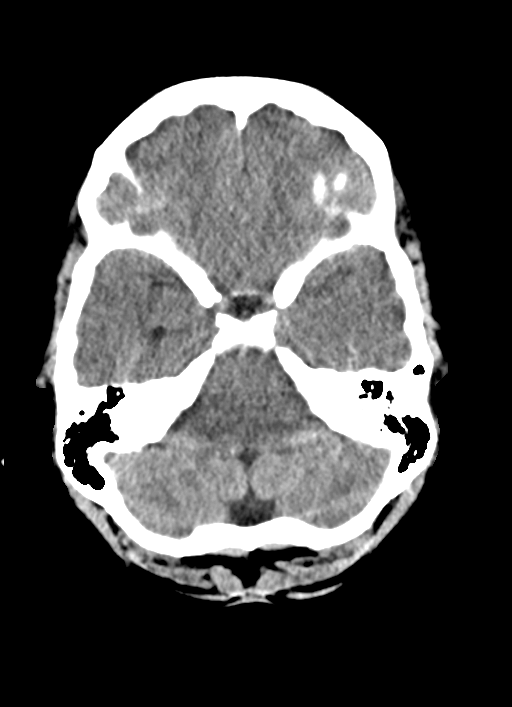
[im 13/33  brain]
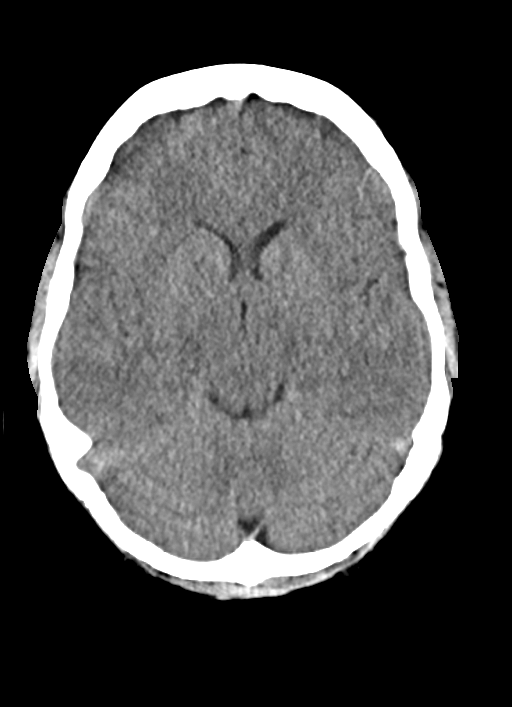
[im 17/33  brain]
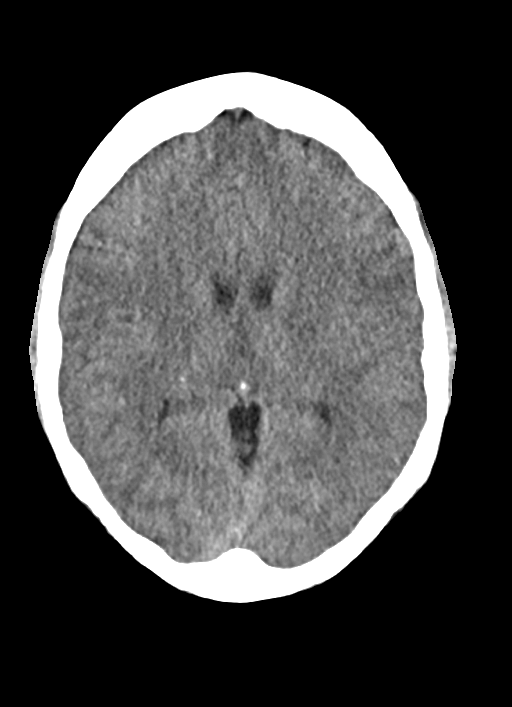
[im 21/33  brain]
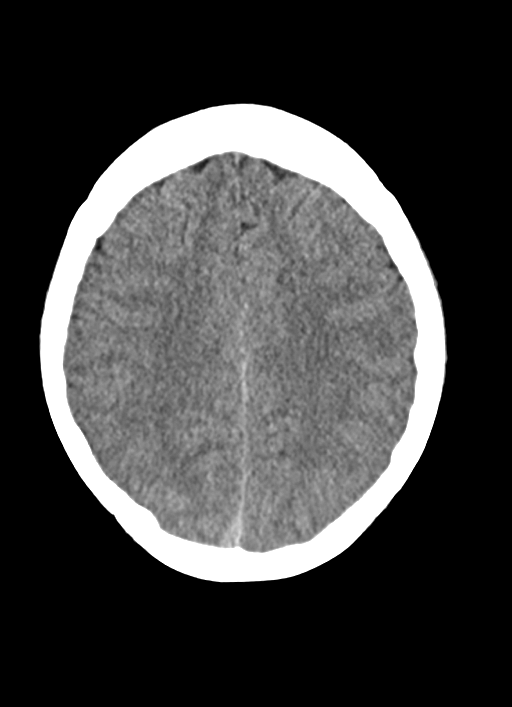
[im 21/33  bone]
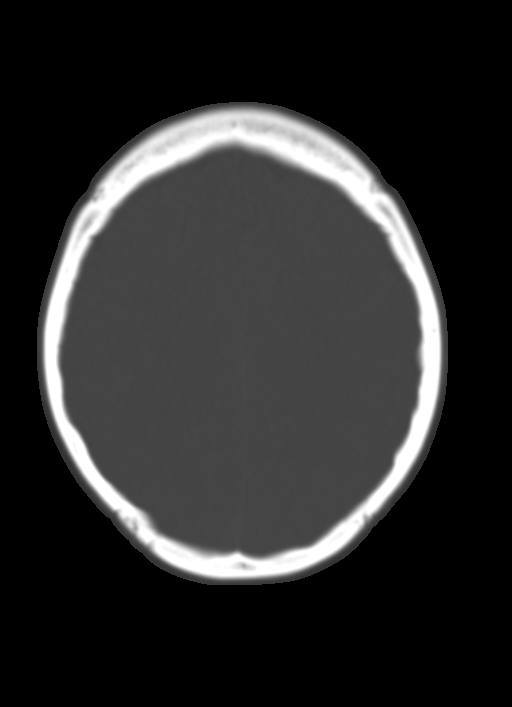
[im 25/33  brain]
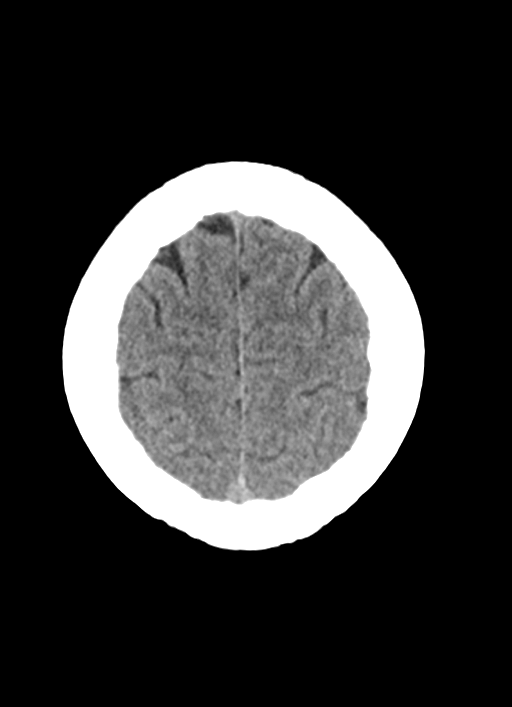
[im 29/33  brain]
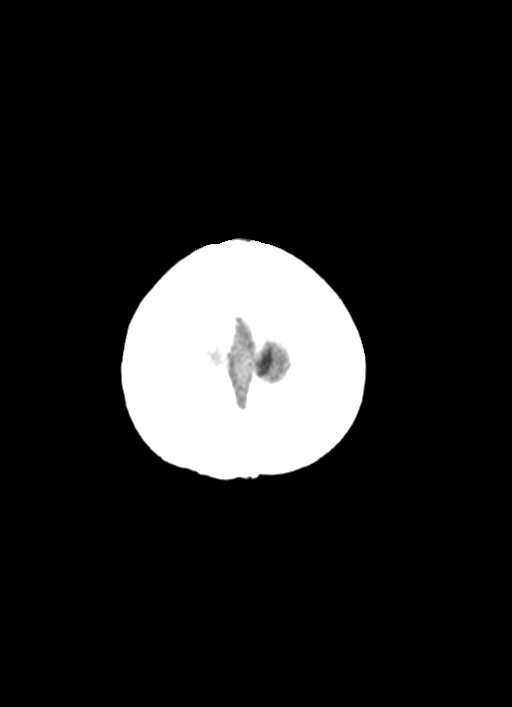

[Series 6: coronals head 3.00 cor · coronal · 0.31mm/px · 3 of 63 slices shown]
[im 21/63  brain]
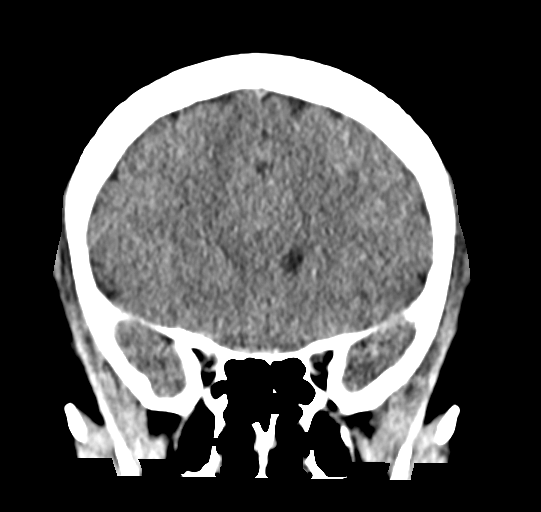
[im 28/63  brain]
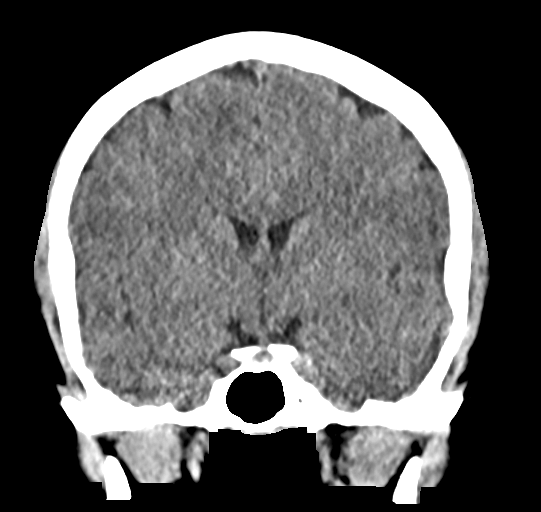
[im 35/63  brain]
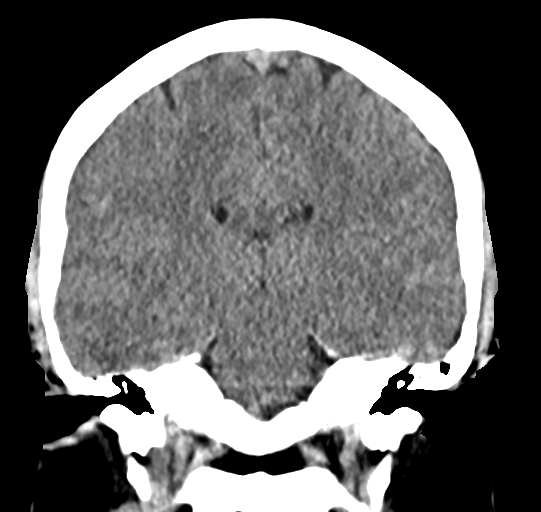

[Series 8: sagittals head 3.00 sag · sagittal · 0.31mm/px · 3 of 55 slices shown]
[im 19/55  brain]
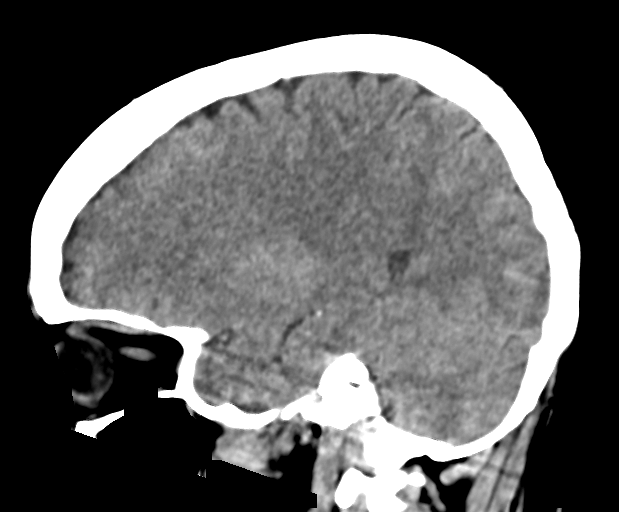
[im 28/55  brain]
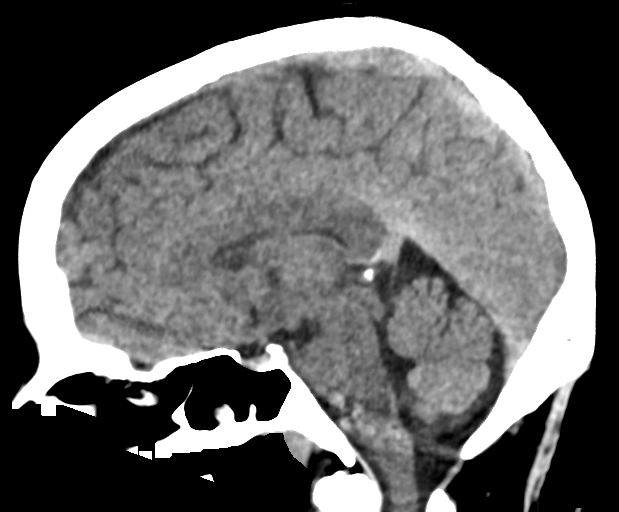
[im 37/55  brain]
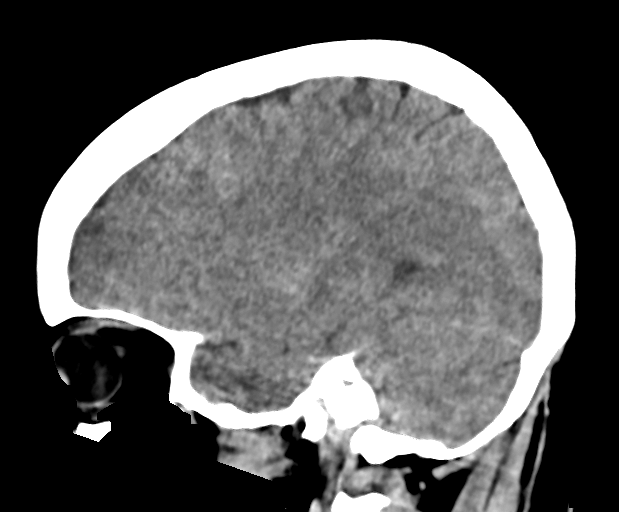

[13 of 47 positions shown; findings below may reference images not displayed]

FINDINGS: Brain: No evidence of acute infarction, hemorrhage, hydrocephalus,
extra-axial collection or mass lesion/mass effect.

Vascular: No hyperdense vessel or unexpected calcification.

Skull: Normal. Negative for fracture or focal lesion.

Sinuses/Orbits: Visualized paranasal sinuses and orbits are
unremarkable to the extent evaluated.

Other: None
IMPRESSION: No acute intracranial abnormality.

## 2023-04-29 DIAGNOSIS — Z419 Encounter for procedure for purposes other than remedying health state, unspecified: Secondary | ICD-10-CM | POA: Diagnosis not present

## 2023-05-29 DIAGNOSIS — Z419 Encounter for procedure for purposes other than remedying health state, unspecified: Secondary | ICD-10-CM | POA: Diagnosis not present

## 2023-05-30 ENCOUNTER — Encounter: Payer: Self-pay | Admitting: Urology

## 2023-05-30 ENCOUNTER — Ambulatory Visit: Payer: BC Managed Care – PPO | Admitting: Urology

## 2023-06-01 ENCOUNTER — Encounter: Payer: Self-pay | Admitting: Urology

## 2023-06-12 ENCOUNTER — Encounter: Payer: Self-pay | Admitting: Urology

## 2023-06-14 ENCOUNTER — Other Ambulatory Visit: Payer: Self-pay | Admitting: *Deleted

## 2023-06-14 MED ORDER — GEMTESA 75 MG PO TABS: 75.0000 mg | ORAL_TABLET | Freq: Every day | ORAL | 11 refills | Status: DC

## 2023-06-29 DIAGNOSIS — Z419 Encounter for procedure for purposes other than remedying health state, unspecified: Secondary | ICD-10-CM | POA: Diagnosis not present

## 2023-07-03 ENCOUNTER — Other Ambulatory Visit: Payer: Self-pay | Admitting: Nurse Practitioner

## 2023-07-04 ENCOUNTER — Encounter: Payer: Self-pay | Admitting: Urology

## 2023-07-04 ENCOUNTER — Ambulatory Visit (INDEPENDENT_AMBULATORY_CARE_PROVIDER_SITE_OTHER): Payer: BC Managed Care – PPO | Admitting: Urology

## 2023-07-04 VITALS — BP 171/89 | HR 84 | Ht 61.0 in | Wt 194.0 lb

## 2023-07-04 DIAGNOSIS — N3281 Overactive bladder: Secondary | ICD-10-CM | POA: Diagnosis not present

## 2023-07-04 DIAGNOSIS — N3946 Mixed incontinence: Secondary | ICD-10-CM

## 2023-07-04 LAB — URINALYSIS, COMPLETE
Bilirubin, UA: NEGATIVE
Glucose, UA: NEGATIVE
Ketones, UA: NEGATIVE
Leukocytes,UA: NEGATIVE
Nitrite, UA: NEGATIVE
Protein,UA: NEGATIVE
RBC, UA: NEGATIVE
Specific Gravity, UA: 1.02 (ref 1.005–1.030)
Urobilinogen, Ur: 0.2 mg/dL (ref 0.2–1.0)
pH, UA: 5.5 (ref 5.0–7.5)

## 2023-07-04 LAB — MICROSCOPIC EXAMINATION: Epithelial Cells (non renal): >10 /[HPF] — AB (ref 0–10)

## 2023-07-04 MED ORDER — SOLIFENACIN SUCCINATE 5 MG PO TABS: 5.0000 mg | ORAL_TABLET | Freq: Every day | ORAL | 11 refills | Status: DC

## 2023-07-04 NOTE — Progress Notes (Signed)
 07/04/2023 10:03 AM   Tiffany Odom [birth year] [id number]  Referring provider: Valerio Melanie DASEN, NP [address] [place],  [place] [address]  Chief Complaint  Patient presents with   Follow-up   Over Active Bladder    HPI: I was consulted to assess the patient's urinary incontinence.  She leaks with coughing sneezing sometimes with bending and lifting.  Sometimes incontinence with lifting is significant.  She has urge incontinence.  Both are significant.  She has no bedwetting.  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  She wears 2 or 3 pads a day with varying amount of leakage sometimes depending on activity   She can void every 60 to 90 minutes and other times every 2-3 hours.  Sometimes she gets up once a night.  She voids with a strong flow   She has had a hysterectomy.  Oxybutynin  caused dizziness but helped up to 75% in the past.     Mild grade 2 hypermobility of the bladder neck and leaked a few drops. No cystocele. No rectocele    Patient has mixed incontinence.  She has frequency and milder nocturia.    On urodynamics patient voided 129 mL with a maximal of 8 mL/s.  Residual was a few milliliters.  Maximum bladder capacity was 200 mL.  She increased bladder sensation.  Bladder was unstable reaching pressures of 24 cm of water.  The overactivity was also triggered and impressive with severe urge incontinence.  Many leak point pressures were assessed.  She did leak once with a Valsalva leak point pressure 200 mL or 33 cm of water with mild leakage.  During voiding she voided 93 mL with a maximal flow of 8 mL/s.  Maximum voiding pressure of 14 cm water.  It was well-sustained.  Residual 0 mL.  EMG activity was normal.  Bladder neck descent at less than a centimeter.     Patient had mild grade 2 hypermobility the bladder neck and did leak a few drops after cystoscopy.  She had 2 very small suburethral swellings within normal limits.  I do not think she has a diverticulum.    Cystoscopy: Patient underwent flexible cystoscopy.  Bladder mucosa and trigone were normal.  No cystitis.  No carcinoma.  Very well-tolerated.  No diverticulum noted     Patient has mixed incontinence.  She has significant overactive bladder.  75% overactive bladder and 25% stress incontinence. She has failed oxybutynin .  I will try to help her with medical and behavioral therapy.  Physical therapy offered but she thinks she has done it in the past and failed.  If she does not reach her treatment goal she could be offered a bulking agent or sling.  If she could live with her stress incontinence she could be offered third line therapies.  She may be at higher risk of persistent or worsening overactive bladder symptoms based upon urodynamics.  Reassess in 6 weeks on Gemtesa  samples and prescription.    Having less high-volume leakage.  She says she is less than 20% improved.  She says primarily she can leak with almost no warning.  Clinically not infected but sent urine for culture.    Reassess in 6 weeks on Myrbetriq  50 mg samples and prescription.  Reassess severity of each symptom and quantitate them in treatment goals and discuss refractory overactive bladder pathway versus the stress incontinence pathway.  Bulking agent may be better option then sling due to refractory overactive bladder.    Today  Last culture negative Urgency stable incontinence stable.  She never filled the Myrbetriq  because was $400.  She said Gemtesa  worked reasonably well but again insurance would not cover it.  She is prone to dizziness.  She is not having dizziness now.   PMH: Past Medical History:  Diagnosis Date   Depression    Diabetes (HCC)    Hyperlipemia    Hypertension     Surgical History: Past Surgical History:  Procedure Laterality Date   BILATERAL CARPAL TUNNEL RELEASE     CESAREAN SECTION     CHOLECYSTECTOMY     TOTAL ABDOMINAL HYSTERECTOMY W/ BILATERAL SALPINGOOPHORECTOMY      Home Medications:   Allergies as of 07/04/2023       Reactions   Oxycodone-acetaminophen Rash   Overly sedated on Percocet at time of lithotripsy 2010   Sulfamethoxazole-trimethoprim Rash   Drug rash January 2017   Lisinopril Cough   Rosuvastatin Other (See Comments)   Cyclobenzaprine Rash   rasied Blood pressure         Medication List        Accurate as of July 04, 2023 10:03 AM. If you have any questions, ask your nurse or doctor.          amLODipine  5 MG tablet Commonly known as: NORVASC  TAKE 1 TABLET (5 MG TOTAL) BY MOUTH DAILY.   atorvastatin  80 MG tablet Commonly known as: LIPITOR Take 1 tablet (80 mg total) by mouth daily.   Cholecalciferol  1.25 MG (50000 UT) Tabs Take 1 tablet by mouth once a week.   ezetimibe  10 MG tablet Commonly known as: ZETIA  Take 1 tablet (10 mg total) by mouth daily.   FLUoxetine  40 MG capsule Commonly known as: PROZAC  Take 1 capsule (40 mg total) by mouth daily.   freestyle lancets Use to check blood sugar 1-2 times daily.   Gemtesa  75 MG Tabs Generic drug: Vibegron  Take 1 tablet (75 mg total) by mouth daily.   glucose blood test strip Use to check blood sugar 1-2 times daily.   irbesartan  300 MG tablet Commonly known as: AVAPRO  TAKE 1 TABLET BY MOUTH EVERY DAY   OneTouch Verio w/Device Kit Use 1-4 times daily as needed/directed DX E11.9        Allergies:  Allergies  Allergen Reactions   Oxycodone-Acetaminophen Rash    Overly sedated on Percocet at time of lithotripsy 2010   Sulfamethoxazole-Trimethoprim Rash    Drug rash January 2017   Lisinopril Cough   Rosuvastatin Other (See Comments)   Cyclobenzaprine Rash    rasied Blood pressure     Family History: Family History  Problem Relation Age of Onset   Diabetes Mother    ADD / ADHD Mother    Heart Problems Mother    Sleep apnea Mother    Diabetes Father    Heart Problems Father    Healthy Sister    Healthy Sister    Healthy Sister    Healthy Sister     Healthy Sister    Healthy Brother    Heart Problems Maternal Grandmother    Diabetes Maternal Grandmother    Heart Problems Paternal Grandmother    Diabetes Paternal Grandmother     Social History:  reports that she has never smoked. She has never been exposed to tobacco smoke. She has never used smokeless tobacco. She reports current alcohol use. She reports that she does not use drugs.  ROS:  Physical Exam: BP (!) 171/89   Pulse 84   Ht 5' 1 (1.549 m)   Wt 88 kg   BMI 36.66 kg/m   Constitutional:  Alert and oriented, No acute distress. HEENT: [place] AT, moist mucus membranes.  Trachea midline, no masses.  Laboratory Data: Lab Results  Component Value Date   WBC 8.6 08/25/2022   HGB 13.3 08/25/2022   HCT 41.8 08/25/2022   MCV 94 08/25/2022   PLT 363 08/25/2022    Lab Results  Component Value Date   CREATININE 0.78 08/25/2022    No results found for: PSA  No results found for: TESTOSTERONE  Lab Results  Component Value Date   HGBA1C 6.7 (H) 08/25/2022    Urinalysis    Component Value Date/Time   COLORURINE YELLOW (A) 04/15/2022 0228   APPEARANCEUR Cloudy (A) 04/11/2023 1026   LABSPEC 1.020 04/15/2022 0228   PHURINE 7.0 04/15/2022 0228   GLUCOSEU Negative 04/11/2023 1026   HGBUR NEGATIVE 04/15/2022 0228   BILIRUBINUR Negative 04/11/2023 1026   KETONESUR 5 (A) 04/15/2022 0228   PROTEINUR Negative 04/11/2023 1026   PROTEINUR 30 (A) 04/15/2022 0228   NITRITE Negative 04/11/2023 1026   NITRITE NEGATIVE 04/15/2022 0228   LEUKOCYTESUR Negative 04/11/2023 1026   LEUKOCYTESUR SMALL (A) 04/15/2022 0228    Pertinent Imaging:   Assessment & Plan: Patient will try Vesicare  5 mg 30 x 11.  She understands she has a mixed pathway with no clear definite solution.  It be nice and Vesicare  helped her.  Dizziness as a side effect discussed.  Other side effects discussed.  Reevaluate pathway and treatments  in 6 weeks.  Call if culture positive  1. OAB (overactive bladder) (Primary)  - Urinalysis, Complete - CULTURE, URINE COMPREHENSIVE   No follow-ups on file.  Glendia DELENA Elizabeth, MD  [facility] [address] [place], [place] [address] [phone]

## 2023-07-05 NOTE — Telephone Encounter (Signed)
 Rx 08/25/22 #90 [id number]-too soon Requested Prescriptions  Pending Prescriptions Disp Refills   FLUoxetine  (PROZAC ) 40 MG capsule [Pharmacy Med Name: FLUOXETINE  HCL 40 MG CAPSULE] 90 capsule 4    Sig: TAKE 1 CAPSULE (40 MG TOTAL) BY MOUTH DAILY.     Psychiatry:  Antidepressants - SSRI Failed - 07/05/2023  4:06 PM      Failed - Valid encounter within last 6 months    Recent Outpatient Visits           9 months ago Hypertension associated with diabetes [facility])   [facility] [place], Hyla Givens, NP   10 months ago Type 2 diabetes mellitus with morbid obesity (HCC)   [facility] [place], Clayton T, NP   1 year ago Chronic migraine with aura without status migrainosus, not intractable   [facility] [place], Jolene T, NP   1 year ago Migraine with aura and without status migrainosus, not intractable   [place] [facility] [place], [place] T, NP   1 year ago Type 2 diabetes mellitus with morbid obesity (HCC)   [facility] [place], Melanie DASEN, NP       Future Appointments             In 1 month MacDiarmid, Glendia, MD [facility] [place]            Passed - Completed PHQ-2 or PHQ-9 in the last 360 days

## 2023-07-06 LAB — CULTURE, URINE COMPREHENSIVE

## 2023-07-14 ENCOUNTER — Telehealth: Payer: Self-pay

## 2023-07-14 ENCOUNTER — Ambulatory Visit (INDEPENDENT_AMBULATORY_CARE_PROVIDER_SITE_OTHER): Payer: BC Managed Care – PPO | Admitting: Nurse Practitioner

## 2023-07-14 ENCOUNTER — Encounter: Payer: Self-pay | Admitting: Nurse Practitioner

## 2023-07-14 DIAGNOSIS — Z23 Encounter for immunization: Secondary | ICD-10-CM | POA: Diagnosis not present

## 2023-07-14 DIAGNOSIS — E1169 Type 2 diabetes mellitus with other specified complication: Secondary | ICD-10-CM

## 2023-07-14 DIAGNOSIS — E1159 Type 2 diabetes mellitus with other circulatory complications: Secondary | ICD-10-CM | POA: Diagnosis not present

## 2023-07-14 DIAGNOSIS — E559 Vitamin D deficiency, unspecified: Secondary | ICD-10-CM

## 2023-07-14 DIAGNOSIS — I152 Hypertension secondary to endocrine disorders: Secondary | ICD-10-CM | POA: Diagnosis not present

## 2023-07-14 DIAGNOSIS — E785 Hyperlipidemia, unspecified: Secondary | ICD-10-CM

## 2023-07-14 DIAGNOSIS — F419 Anxiety disorder, unspecified: Secondary | ICD-10-CM | POA: Diagnosis not present

## 2023-07-14 DIAGNOSIS — N3281 Overactive bladder: Secondary | ICD-10-CM

## 2023-07-14 DIAGNOSIS — F32A Depression, unspecified: Secondary | ICD-10-CM

## 2023-07-14 LAB — BAYER DCA HB A1C WAIVED: HB A1C (BAYER DCA - WAIVED): 6.3 % — ABNORMAL HIGH (ref 4.8–5.6)

## 2023-07-14 LAB — MICROALBUMIN, URINE WAIVED
Creatinine, Urine Waived: 100 mg/dL (ref 10–300)
Microalb, Ur Waived: 30 mg/L — ABNORMAL HIGH (ref 0–19)
Microalb/Creat Ratio: <30 mg/g (ref <30)

## 2023-07-14 MED ORDER — FLUOXETINE HCL 40 MG PO CAPS: 40.0000 mg | ORAL_CAPSULE | Freq: Every day | ORAL | 4 refills | Status: DC

## 2023-07-14 MED ORDER — BUPROPION HCL ER (XL) 150 MG PO TB24: 150.0000 mg | ORAL_TABLET | Freq: Every day | ORAL | 2 refills | Status: DC

## 2023-07-14 MED ORDER — EZETIMIBE 10 MG PO TABS: 10.0000 mg | ORAL_TABLET | Freq: Every day | ORAL | 4 refills | Status: DC

## 2023-07-14 MED ORDER — AMLODIPINE BESYLATE 5 MG PO TABS: 5.0000 mg | ORAL_TABLET | Freq: Every day | ORAL | 4 refills | Status: DC

## 2023-07-14 MED ORDER — IRBESARTAN 300 MG PO TABS: 300.0000 mg | ORAL_TABLET | Freq: Every day | ORAL | 4 refills | Status: DC

## 2023-07-14 MED ORDER — ATORVASTATIN CALCIUM 80 MG PO TABS: 80.0000 mg | ORAL_TABLET | Freq: Every day | ORAL | 4 refills | Status: DC

## 2023-07-14 NOTE — Assessment & Plan Note (Signed)
Chronic, ongoing with A1c 6.3% and urine ALB 28 July 2023.  At this time will continue diet control, discussed with patient options available if elevation in future; such as GLP1, SGLT2, or Metformin.  She is working on diet changes and regular walks.  Recommend she check blood sugar at home a few days a week with goal in morning fasting <130 and goal 2 hours after eating <180.  - Eye exam needed.  Foot exam up to date. - Vaccinations up to date. - Statin and ARB on board.

## 2023-07-14 NOTE — Assessment & Plan Note (Signed)
Chronic, ongoing.  Continue current medication regimen and adjust as needed. Lipid panel today. 

## 2023-07-14 NOTE — Telephone Encounter (Signed)
PA for Ezetimibe initiated and submitted via Cover My Meds. Key: [id number]

## 2023-07-14 NOTE — Assessment & Plan Note (Signed)
Improving with Vesicare.  Continue collaboration with urology.

## 2023-07-14 NOTE — Assessment & Plan Note (Signed)
Chronic.  Noted on past labs, currently no supplement.  Recheck levels today.

## 2023-07-14 NOTE — Patient Instructions (Signed)

## 2023-07-14 NOTE — Progress Notes (Signed)
BP 130/71   Pulse 69   Temp 98.4 F (36.9 C) (Oral)   Ht 5\' 1"  (1.549 m)   Wt 192 lb 9.6 oz (87.4 kg)   SpO2 98%   BMI 36.39 kg/m    Subjective:    Patient ID: Tiffany Odom, female    DOB: 15-Oct-1975, 48 y.o.   MRN: [id number]  HPI: Tiffany Odom is a 48 y.o. female  Chief Complaint  Patient presents with   Anxiety   Depression   Diabetes    No recent eye exam per patient   Hyperlipidemia   Hypertension   DIABETES Last A1c 6.7% February 2024, has been lost to follow-up since then.  Continues to follow with urology for OAB, Dr. Sherron Monday, last seen 07/04/23.  They started Vesicare and to return in 6 weeks.  She reports this is working pretty decently, less bladder leakage + no side effects thus far. Hypoglycemic episodes:no Polydipsia/polyuria: no Visual disturbance: no Chest pain: no Paresthesias: no Glucose Monitoring: no  Accucheck frequency: Not Checking  Fasting glucose:  Post prandial:  Evening:  Before meals: Taking Insulin?: no  Long acting insulin:  Short acting insulin: Blood Pressure Monitoring: not checking Retinal Examination: Not up to Date -- needs to schedule one Foot Exam: Up to Date Diabetic Education: Not Completed Pneumovax: Up to Date PPSV23 in past Influenza: Up to Date Aspirin: no   HYPERTENSION / HYPERLIPIDEMIA Currently taking Atorvastatin and Irbesartan & Amlodipine daily -- started on Irbesartan back in April or May 2022 due to cough with ACE.  Had cough with Lisinopril after being on this for 10 years.  Rosuvastatin caused pruritus.  Was diagnosed with moderate sleep apnea by [facility] 08/03/21 . She tried multiple different masks and did not tolerate.  Is diet focused at this time. Satisfied with current treatment? yes Duration of hypertension: chronic BP monitoring frequency: not checking BP range:  BP medication side effects: no Duration of hyperlipidemia: chronic Cholesterol medication side effects:  no Cholesterol supplements: none Medication compliance: good compliance Aspirin: no Recent stressors: no Recurrent headaches: no Visual changes: no Palpitations: no Dyspnea: no Chest pain: no Lower extremity edema: no Dizzy/lightheaded: no   DEPRESSION Takes Prozac 40 MG daily for depression daily, been on for >18 years. In a long distance relationship and is moving to [place] around July with children to live with him. Mood status: exacerbated Satisfied with current treatment?: yes Symptom severity: moderate  Duration of current treatment : chronic Side effects: no Medication compliance: good compliance Psychotherapy/counseling: none Depressed mood: little bit here and there Anxious mood:  little bit here and there Anhedonia: no Significant weight loss or gain: no Insomnia: occasionally -- with waking up Fatigue: yes Feelings of worthlessness or guilt: no Impaired concentration/indecisiveness: sometimes Suicidal ideations: no Hopelessness: no Crying spells: occasional    07/14/2023   10:48 AM 08/25/2022    8:44 AM 04/20/2022   11:34 AM 02/17/2022    9:53 AM 10/23/2021    9:12 AM  Depression screen PHQ 2/9  Decreased Interest 1 1 2 2 2   Down, Depressed, Hopeless 1 2 2 1 1   PHQ - 2 Score 2 3 4 3 3   Altered sleeping 2 1 1 1 2   Tired, decreased energy 2 1 3 3 2   Change in appetite 3 2 2 2 3   Feeling bad or failure about yourself  1 1 2 2 2   Trouble concentrating 1 1 1 1 2   Moving slowly or fidgety/restless 0 0 0  0 0  Suicidal thoughts 0 0 0 1 0  PHQ-9 Score 11 9 13 13 14   Difficult doing work/chores Somewhat difficult Somewhat difficult Somewhat difficult Somewhat difficult        07/14/2023   10:48 AM 04/20/2022   11:34 AM 02/17/2022    9:54 AM 10/23/2021    9:13 AM  GAD 7 : Generalized Anxiety Score  Nervous, Anxious, on Edge 2 3 1 2   Control/stop worrying 1 3 2 2   Worry too much - different things 1 3 2 2   Trouble relaxing 1 2 1 2   Restless 1 2 1 1   Easily  annoyed or irritable 2 2 1 2   Afraid - awful might happen 2 2 2 2   Total GAD 7 Score 10 17 10 13   Anxiety Difficulty Somewhat difficult Somewhat difficult Somewhat difficult Somewhat difficult     Relevant past medical, surgical, family and social history reviewed and updated as indicated. Interim medical history since our last visit reviewed. Allergies and medications reviewed and updated.  Review of Systems  Constitutional:  Negative for activity change, appetite change, diaphoresis, fatigue and fever.  Respiratory:  Negative for cough, chest tightness and shortness of breath.   Cardiovascular:  Negative for chest pain, palpitations and leg swelling.  Gastrointestinal: Negative.   Endocrine: Negative for polydipsia, polyphagia and polyuria.  Neurological: Negative.   Psychiatric/Behavioral: Negative.      Per HPI unless specifically indicated above     Objective:    BP 130/71   Pulse 69   Temp 98.4 F (36.9 C) (Oral)   Ht 5\' 1"  (1.549 m)   Wt 192 lb 9.6 oz (87.4 kg)   SpO2 98%   BMI 36.39 kg/m   Wt Readings from Last 3 Encounters:  07/14/23 192 lb 9.6 oz (87.4 kg)  07/04/23 194 lb (88 kg)  01/24/23 185 lb (83.9 kg)    Physical Exam Vitals and nursing note reviewed.  Constitutional:      General: She is awake. She is not in acute distress.    Appearance: She is well-developed and well-groomed. She is obese. She is not ill-appearing or toxic-appearing.  HENT:     Head: Normocephalic.     Right Ear: Hearing and external ear normal.     Left Ear: Hearing and external ear normal.  Eyes:     General: Lids are normal.        Right eye: No discharge.        Left eye: No discharge.     Conjunctiva/sclera: Conjunctivae normal.     Pupils: Pupils are equal, round, and reactive to light.  Neck:     Thyroid: No thyromegaly.     Vascular: No carotid bruit.  Cardiovascular:     Rate and Rhythm: Normal rate and regular rhythm.     Heart sounds: Normal heart sounds. No  murmur heard.    No gallop.  Pulmonary:     Effort: Pulmonary effort is normal. No accessory muscle usage or respiratory distress.     Breath sounds: Normal breath sounds.  Abdominal:     General: Bowel sounds are normal. There is no distension.     Palpations: Abdomen is soft.     Tenderness: There is no abdominal tenderness.  Musculoskeletal:     Cervical back: Normal range of motion and neck supple.     Right lower leg: No edema.     Left lower leg: No edema.  Lymphadenopathy:     Cervical: No  cervical adenopathy.  Skin:    General: Skin is warm and dry.  Neurological:     Mental Status: She is alert and oriented to person, place, and time.     Deep Tendon Reflexes: Reflexes are normal and symmetric.     Reflex Scores:      Brachioradialis reflexes are 2+ on the right side and 2+ on the left side.      Patellar reflexes are 2+ on the right side and 2+ on the left side. Psychiatric:        Attention and Perception: Attention normal.        Mood and Affect: Mood normal.        Speech: Speech normal.        Behavior: Behavior normal. Behavior is cooperative.        Thought Content: Thought content normal.     Results for orders placed or performed in visit on 07/04/23  CULTURE, URINE COMPREHENSIVE   Collection Time: 07/04/23  9:59 AM   Specimen: Urine   UR  Result Value Ref Range   Urine Culture, Comprehensive Final report    Organism ID, Bacteria Comment   Microscopic Examination   Collection Time: 07/04/23  9:59 AM   Urine  Result Value Ref Range   WBC, UA 0-5 0 - 5 /hpf   RBC, Urine 0-2 0 - 2 /hpf   Epithelial Cells (non renal) >10 (A) 0 - 10 /hpf   Bacteria, UA Moderate (A) None seen/Few  Urinalysis, Complete   Collection Time: 07/04/23  9:59 AM  Result Value Ref Range   Specific Gravity, UA 1.020 1.005 - 1.030   pH, UA 5.5 5.0 - 7.5   Color, UA Yellow Yellow   Appearance Ur Clear Clear   Leukocytes,UA Negative Negative   Protein,UA Negative Negative/Trace    Glucose, UA Negative Negative   Ketones, UA Negative Negative   RBC, UA Negative Negative   Bilirubin, UA Negative Negative   Urobilinogen, Ur 0.2 0.2 - 1.0 mg/dL   Nitrite, UA Negative Negative   Microscopic Examination See below:       Assessment & Plan:   Problem List Items Addressed This Visit       Cardiovascular and Mediastinum   Hypertension associated with diabetes (HCC)   Chronic, ongoing.  BP at goal today.  Restart Irbesartan 300 MG daily and Amlodipine 5 MG daily, however if low BPs present stop Amlodipine -- discussed with her.  Recommend she monitor BP at least a few mornings a week at home and document.  DASH diet at home.  Labs: CBC, CMP, TSH, urine ALB.  Urine ALB 28 July 2023.  Could consider change to Candesartan in future, which may also benefit migraines.        Relevant Medications   amLODipine (NORVASC) 5 MG tablet   atorvastatin (LIPITOR) 80 MG tablet   ezetimibe (ZETIA) 10 MG tablet   irbesartan (AVAPRO) 300 MG tablet   Other Relevant Orders   Bayer DCA Hb A1c Waived   Microalbumin, Urine Waived   CBC with Differential/Platelet   TSH     Endocrine   Hyperlipidemia associated with type 2 diabetes mellitus (HCC)   Chronic, ongoing.  Continue current medication regimen and adjust as needed.  Lipid panel today.         Relevant Medications   amLODipine (NORVASC) 5 MG tablet   atorvastatin (LIPITOR) 80 MG tablet   ezetimibe (ZETIA) 10 MG tablet   irbesartan (AVAPRO) 300 MG  tablet   Other Relevant Orders   Bayer DCA Hb A1c Waived   Comprehensive metabolic panel   Lipid Panel w/o Chol/HDL Ratio   Type 2 diabetes mellitus with morbid obesity (HCC) - Primary   Chronic, ongoing with A1c 6.3% and urine ALB 28 July 2023.  At this time will continue diet control, discussed with patient options available if elevation in future; such as GLP1, SGLT2, or Metformin.  She is working on diet changes and regular walks.  Recommend she check blood sugar at  home a few days a week with goal in morning fasting <130 and goal 2 hours after eating <180.  - Eye exam needed.  Foot exam up to date. - Vaccinations up to date. - Statin and ARB on board.      Relevant Medications   atorvastatin (LIPITOR) 80 MG tablet   irbesartan (AVAPRO) 300 MG tablet   Other Relevant Orders   Bayer DCA Hb A1c Waived   Microalbumin, Urine Waived     Genitourinary   OAB (overactive bladder)   Improving with Vesicare.  Continue collaboration with urology.        Other   Anxiety and depression   Chronic, exacerbated at this time with scores up on depression and down on anxiety.  Does endorse fatigue, anhedonia, and difficulty with focus.  Discussed options with her.  Will continue Prozac 40 MG daily, has been on for long while and offers benefit, and then start Wellbutrin XL 150 MG daily.  Educated her on this medication and differences from current SSRI family.  This may benefit some of her more depressive symptoms and maintaining Prozac may continue to help anxiety.  Educated her on side effects and BLACK BOX warning.  Return in 6 weeks.      Relevant Medications   buPROPion (WELLBUTRIN XL) 150 MG 24 hr tablet   FLUoxetine (PROZAC) 40 MG capsule   Morbid obesity (HCC)   BMI 36.39 with T2DM, HTN/HLD.  Recommended eating smaller high protein, low fat meals more frequently and exercising 30 mins a day 5 times a week with a goal of 10-15lb weight loss in the next 3 months. Patient voiced their understanding and motivation to adhere to these recommendations.       Vitamin D deficiency   Chronic.  Noted on past labs, currently no supplement.  Recheck levels today.      Relevant Orders   VITAMIN D 25 Hydroxy (Vit-D Deficiency, Fractures)   Other Visit Diagnoses       Need for influenza vaccination       Flu vaccine today, educated patient.   Relevant Orders   Flu vaccine trivalent PF, 6mos and older(Flulaval,Afluria,Fluarix,Fluzone) (Completed)     Need for  COVID-19 vaccine       Covid vaccine today, educated patient.   Relevant Orders   Pfizer Comirnaty Covid -19 Vaccine 37yrs and older (Completed)        Follow up plan: Return in about 6 weeks (around 08/25/2023) for Depression -- virtual.

## 2023-07-14 NOTE — Assessment & Plan Note (Signed)
Chronic, ongoing.  BP at goal today.  Restart Irbesartan 300 MG daily and Amlodipine 5 MG daily, however if low BPs present stop Amlodipine -- discussed with Tiffany Odom.  Recommend she monitor BP at least a few mornings a week at home and document.  DASH diet at home.  Labs: CBC, CMP, TSH, urine ALB.  Urine ALB 28 July 2023.  Could consider change to Candesartan in future, which may also benefit migraines.

## 2023-07-14 NOTE — Assessment & Plan Note (Signed)
BMI 36.39 with T2DM, HTN/HLD.  Recommended eating smaller high protein, low fat meals more frequently and exercising 30 mins a day 5 times a week with a goal of 10-15lb weight loss in the next 3 months. Patient voiced their understanding and motivation to adhere to these recommendations.

## 2023-07-14 NOTE — Assessment & Plan Note (Signed)
Chronic, exacerbated at this time with scores up on depression and down on anxiety.  Does endorse fatigue, anhedonia, and difficulty with focus.  Discussed options with her.  Will continue Prozac 40 MG daily, has been on for long while and offers benefit, and then start Wellbutrin XL 150 MG daily.  Educated her on this medication and differences from current SSRI family.  This may benefit some of her more depressive symptoms and maintaining Prozac may continue to help anxiety.  Educated her on side effects and BLACK BOX warning.  Return in 6 weeks.

## 2023-07-15 ENCOUNTER — Encounter: Payer: Self-pay | Admitting: Nurse Practitioner

## 2023-07-15 LAB — LIPID PANEL W/O CHOL/HDL RATIO
Cholesterol, Total: 207 mg/dL — ABNORMAL HIGH (ref 100–199)
HDL: 39 mg/dL — ABNORMAL LOW (ref >39)
LDL Chol Calc (NIH): 134 mg/dL — ABNORMAL HIGH (ref 0–99)
Triglycerides: 191 mg/dL — ABNORMAL HIGH (ref 0–149)
VLDL Cholesterol Cal: 34 mg/dL (ref 5–40)

## 2023-07-15 LAB — CBC WITH DIFFERENTIAL/PLATELET
Basophils Absolute: 0.1 10*3/uL (ref 0.0–0.2)
Basos: 1 %
EOS (ABSOLUTE): 0.2 10*3/uL (ref 0.0–0.4)
Eos: 2 %
Hematocrit: 43.8 % (ref 34.0–46.6)
Hemoglobin: 14.4 g/dL (ref 11.1–15.9)
Immature Grans (Abs): 0 10*3/uL (ref 0.0–0.1)
Immature Granulocytes: 0 %
Lymphocytes Absolute: 3.1 10*3/uL (ref 0.7–3.1)
Lymphs: 35 %
MCH: 30.8 pg (ref 26.6–33.0)
MCHC: 32.9 g/dL (ref 31.5–35.7)
MCV: 94 fL (ref 79–97)
Monocytes Absolute: 0.5 10*3/uL (ref 0.1–0.9)
Monocytes: 5 %
Neutrophils Absolute: 5.2 10*3/uL (ref 1.4–7.0)
Neutrophils: 57 %
Platelets: 387 10*3/uL (ref 150–450)
RBC: 4.68 x10E6/uL (ref 3.77–5.28)
RDW: 11.4 % — ABNORMAL LOW (ref 11.7–15.4)
WBC: 8.9 10*3/uL (ref 3.4–10.8)

## 2023-07-15 LAB — COMPREHENSIVE METABOLIC PANEL
ALT: 23 [IU]/L (ref 0–32)
AST: 14 [IU]/L (ref 0–40)
Albumin: 4.3 g/dL (ref 3.9–4.9)
Alkaline Phosphatase: 95 [IU]/L (ref 44–121)
BUN/Creatinine Ratio: 20 (ref 9–23)
BUN: 16 mg/dL (ref 6–24)
Bilirubin Total: 0.8 mg/dL (ref 0.0–1.2)
CO2: 22 mmol/L (ref 20–29)
Calcium: 10.1 mg/dL (ref 8.7–10.2)
Chloride: 100 mmol/L (ref 96–106)
Creatinine, Ser: 0.82 mg/dL (ref 0.57–1.00)
Globulin, Total: 2.5 g/dL (ref 1.5–4.5)
Glucose: 108 mg/dL — ABNORMAL HIGH (ref 70–99)
Potassium: 4.4 mmol/L (ref 3.5–5.2)
Sodium: 137 mmol/L (ref 134–144)
Total Protein: 6.8 g/dL (ref 6.0–8.5)
eGFR: 89 mL/min/{1.73_m2} (ref >59)

## 2023-07-15 LAB — TSH: TSH: 1.29 u[IU]/mL (ref 0.450–4.500)

## 2023-07-15 LAB — VITAMIN D 25 HYDROXY (VIT D DEFICIENCY, FRACTURES): Vit D, 25-Hydroxy: 40.3 ng/mL (ref 30.0–100.0)

## 2023-07-15 NOTE — Progress Notes (Signed)
Contacted via MyChart   Good evening Tiffany Odom, your labs have returned and overall look great with exception of lipid panel which shows elevations. Please ensure you are taking Atorvastatin daily for stroke prevention.  Any questions? Keep being amazing!!  Thank you for allowing me to participate in your care.  I appreciate you. Kindest regards, Nyellie Yetter

## 2023-07-18 ENCOUNTER — Other Ambulatory Visit: Payer: Self-pay

## 2023-07-18 DIAGNOSIS — E1169 Type 2 diabetes mellitus with other specified complication: Secondary | ICD-10-CM

## 2023-07-18 MED ORDER — ACCU-CHEK AVIVA PLUS W/DEVICE KIT: 1.0000 | PACK | Freq: Two times a day (BID) | 0 refills | Status: AC

## 2023-07-18 MED ORDER — ACCU-CHEK SOFTCLIX LANCETS MISC: 1.0000 | Freq: Two times a day (BID) | 12 refills | Status: AC

## 2023-07-18 MED ORDER — ACCU-CHEK AVIVA PLUS VI STRP: 1.0000 | ORAL_STRIP | Freq: Two times a day (BID) | 12 refills | Status: AC

## 2023-07-19 NOTE — Telephone Encounter (Signed)
PA approved. Will notify patient via mychart.

## 2023-07-21 MED ORDER — BUSPIRONE HCL 5 MG PO TABS: 5.0000 mg | ORAL_TABLET | Freq: Two times a day (BID) | ORAL | 3 refills | Status: DC

## 2023-07-30 DIAGNOSIS — Z419 Encounter for procedure for purposes other than remedying health state, unspecified: Secondary | ICD-10-CM | POA: Diagnosis not present

## 2023-08-21 NOTE — Patient Instructions (Signed)
 Managing Depression, Adult Depression is a mental health condition that affects your thoughts, feelings, and actions. Being diagnosed with depression can bring you relief if you did not know why you have felt or behaved a certain way. It could also leave you feeling overwhelmed. Finding ways to manage your symptoms can help you feel more positive about your future. How to manage lifestyle changes Being depressed is difficult. Depression can increase the level of everyday stress. Stress can make depression symptoms worse. You may believe your symptoms cannot be managed or will never improve. However, there are many things you can try to help manage your symptoms. There is hope. Managing stress  Stress is your body's reaction to life changes and events, both good and bad. Stress can add to your feelings of depression. Learning to manage your stress can help lessen your feelings of depression. Try some of the following approaches to reducing your stress (stress reduction techniques): Listen to music that you enjoy and that inspires you. Try using a meditation app or take a meditation class. Develop a practice that helps you connect with your spiritual self. Walk in nature, pray, or go to a place of worship. Practice deep breathing. To do this, inhale slowly through your nose. Pause at the top of your inhale for a few seconds and then exhale slowly, letting yourself relax. Repeat this three or four times. Practice yoga to help relax and work your muscles. Choose a stress reduction technique that works for you. These techniques take time and practice to develop. Set aside 5-15 minutes a day to do them. Therapists can offer training in these techniques. Do these things to help manage stress: Keep a journal. Know your limits. Set healthy boundaries for yourself and others, such as saying "no" when you think something is too much. Pay attention to how you react to certain situations. You may not be able to  control everything, but you can change your reaction. Add humor to your life by watching funny movies or shows. Make time for activities that you enjoy and that relax you. Spend less time using electronics, especially at night before bed. The light from screens can make your brain think it is time to get up rather than go to bed.  Medicines Medicines, such as antidepressants, are often a part of treatment for depression. Talk with your pharmacist or health care provider about all the medicines, supplements, and herbal products that you take, their possible side effects, and what medicines and other products are safe to take together. Make sure to report any side effects you may have to your health care provider. Relationships Your health care provider may suggest family therapy, couples therapy, or individual therapy as part of your treatment. How to recognize changes Everyone responds differently to treatment for depression. As you recover from depression, you may start to: Have more interest in doing activities. Feel more hopeful. Have more energy. Eat a more regular amount of food. Have better mental focus. It is important to recognize if your depression is not getting better or is getting worse. The symptoms you had in the beginning may return, such as: Feeling tired. Eating too much or too little. Sleeping too much or too little. Feeling restless, agitated, or hopeless. Trouble focusing or making decisions. Having unexplained aches and pains. Feeling irritable, angry, or aggressive. If you or your family members notice these symptoms coming back, let your health care provider know right away. Follow these instructions at home: Activity Try to  get some form of exercise each day, such as walking. Try yoga, mindfulness, or other stress reduction techniques. Participate in group activities if you are able. Lifestyle Get enough sleep. Cut down on or stop using caffeine, tobacco,  alcohol, and any other harmful substances. Eat a healthy diet that includes plenty of vegetables, fruits, whole grains, low-fat dairy products, and lean protein. Limit foods that are high in solid fats, added sugar, or salt (sodium). General instructions Take over-the-counter and prescription medicines only as told by your health care provider. Keep all follow-up visits. It is important for your health care provider to check on your mood, behavior, and medicines. Your health care provider may need to make changes to your treatment. Where to find support Talking to others  Friends and family members can be sources of support and guidance. Talk to trusted friends or family members about your condition. Explain your symptoms and let them know that you are working with a health care provider to treat your depression. Tell friends and family how they can help. Finances Find mental health providers that fit with your financial situation. Talk with your health care provider if you are worried about access to food, housing, or medicine. Call your insurance company to learn about your co-pays and prescription plan. Where to find more information You can find support in your area from: Anxiety and Depression Association of America (ADAA): adaa.org Mental Health America: mentalhealthamerica.net [employer] on Mental Illness: nami.org Contact a health care provider if: You stop taking your antidepressant medicines, and you have any of these symptoms: Nausea. Headache. Light-headedness. Chills and body aches. Not being able to sleep (insomnia). You or your friends and family think your depression is getting worse. Get help right away if: You have thoughts of hurting yourself or others. Get help right away if you feel like you may hurt yourself or others, or have thoughts about taking your own life. Go to your nearest emergency room or: Call 911. Call the National Suicide Prevention Lifeline at  360-042-0264 or 988. This is open 24 hours a day. Text the Crisis Text Line at 952-302-5572. This information is not intended to replace advice given to you by your health care provider. Make sure you discuss any questions you have with your health care provider. Document Revised: 10/20/2021 Document Reviewed: 10/20/2021 Elsevier Patient Education  2024 [employer].

## 2023-08-26 ENCOUNTER — Encounter: Payer: Self-pay | Admitting: Nurse Practitioner

## 2023-08-26 ENCOUNTER — Telehealth: Payer: BC Managed Care – PPO | Admitting: Nurse Practitioner

## 2023-08-26 DIAGNOSIS — F32A Depression, unspecified: Secondary | ICD-10-CM | POA: Diagnosis not present

## 2023-08-26 DIAGNOSIS — F419 Anxiety disorder, unspecified: Secondary | ICD-10-CM | POA: Diagnosis not present

## 2023-08-26 MED ORDER — FLUOXETINE HCL 20 MG PO CAPS: 20.0000 mg | ORAL_CAPSULE | Freq: Every day | ORAL | 3 refills | Status: DC

## 2023-08-26 MED ORDER — BUSPIRONE HCL 5 MG PO TABS: 5.0000 mg | ORAL_TABLET | Freq: Two times a day (BID) | ORAL | 3 refills | Status: DC

## 2023-08-26 NOTE — Assessment & Plan Note (Signed)
 Chronic, exacerbated at this time due to boyfriend breaking up with her. Denies SI/HI.  Did not tolerate Wellbutrin and has not started Buspar.  Will continue Prozac but increase to 60 MG daily, has been on for long while and offers benefit, plus recommend she trial Buspar.  Educated her on this medication and possible side effects.  Return in 6 weeks.

## 2023-08-26 NOTE — Progress Notes (Signed)
 There were no vitals taken for this visit.   Subjective:    Patient ID: Tiffany Odom, female    DOB: [birth year], 48 y.o.   MRN: [id number]  HPI: Tiffany Odom is a 48 y.o. female  Chief Complaint  Patient presents with   Depression   Virtual Visit via Video Note  I connected with Tiffany Odom on 08/26/23 at  3:20 PM EST by a video enabled telemedicine application and verified that I am speaking with the correct person using two identifiers.  Location: Patient: home Provider: work   I discussed the limitations of evaluation and management by telemedicine and the availability of in person appointments. The patient expressed understanding and agreed to proceed.  I discussed the assessment and treatment plan with the patient. The patient was provided an opportunity to ask questions and all were answered. The patient agreed with the plan and demonstrated an understanding of the instructions.   The patient was advised to call back or seek an in-person evaluation if the symptoms worsen or if the condition fails to improve as anticipated.  I provided 25 minutes of non-face-to-face time during this encounter.   Marjie Skiff, NP   DEPRESSION Taking Prozac 40 MG daily, has been on for long period.  Current exacerbated by her boyfriend breaking up with her on Valentine's, she had been going to move to [place] in the summer to be with him. Wellbutrin caused poor sleep pattern and some anxiety.  Has not picked up Buspar.   Mood status: exacerbated Satisfied with current treatment?: yes Symptom severity: moderate  Duration of current treatment : chronic Side effects: no Medication compliance: good compliance Psychotherapy/counseling: yes in the past Previous psychiatric medications: Wellbutrin Depressed mood: yes Anxious mood: yes Anhedonia: yes Significant weight loss or gain: no Insomnia: yes hard to fall asleep Fatigue: yes Feelings of worthlessness or guilt:  no Impaired concentration/indecisiveness: no Suicidal ideations: no Hopelessness: no Crying spells: yes    08/26/2023    3:23 PM 07/14/2023   10:48 AM 08/25/2022    8:44 AM 04/20/2022   11:34 AM 02/17/2022    9:53 AM  Depression screen PHQ 2/9  Decreased Interest 1 1 1 2 2   Down, Depressed, Hopeless 3 1 2 2 1   PHQ - 2 Score 4 2 3 4 3   Altered sleeping 2 2 1 1 1   Tired, decreased energy 2 2 1 3 3   Change in appetite 2 3 2 2 2   Feeling bad or failure about yourself  2 1 1 2 2   Trouble concentrating 1 1 1 1 1   Moving slowly or fidgety/restless 0 0 0 0 0  Suicidal thoughts 0 0 0 0 1  PHQ-9 Score 13 11 9 13 13   Difficult doing work/chores Somewhat difficult Somewhat difficult Somewhat difficult Somewhat difficult Somewhat difficult       08/26/2023    3:24 PM 07/14/2023   10:48 AM 04/20/2022   11:34 AM 02/17/2022    9:54 AM  GAD 7 : Generalized Anxiety Score  Nervous, Anxious, on Edge 2 2 3 1   Control/stop worrying 3 1 3 2   Worry too much - different things 2 1 3 2   Trouble relaxing 2 1 2 1   Restless 1 1 2 1   Easily annoyed or irritable 2 2 2 1   Afraid - awful might happen 2 2 2 2   Total GAD 7 Score 14 10 17 10   Anxiety Difficulty Somewhat difficult Somewhat difficult Somewhat difficult Somewhat difficult  Relevant past medical, surgical, family and social history reviewed and updated as indicated. Interim medical history since our last visit reviewed. Allergies and medications reviewed and updated.  Review of Systems  Constitutional:  Negative for activity change, appetite change, diaphoresis, fatigue and fever.  Respiratory:  Negative for cough, chest tightness and shortness of breath.   Cardiovascular:  Negative for chest pain, palpitations and leg swelling.  Gastrointestinal: Negative.   Endocrine: Negative for polydipsia, polyphagia and polyuria.  Neurological: Negative.   Psychiatric/Behavioral:  Positive for sleep disturbance. Negative for decreased concentration,  self-injury and suicidal ideas. The patient is nervous/anxious.     Per HPI unless specifically indicated above     Objective:    There were no vitals taken for this visit.  Wt Readings from Last 3 Encounters:  07/14/23 192 lb 9.6 oz (87.4 kg)  07/04/23 194 lb (88 kg)  01/24/23 185 lb (83.9 kg)    Physical Exam Vitals and nursing note reviewed.  Constitutional:      General: She is awake. She is not in acute distress.    Appearance: She is well-developed and well-groomed. She is obese. She is not ill-appearing or toxic-appearing.  HENT:     Head: Normocephalic.     Right Ear: Hearing normal.     Left Ear: Hearing normal.  Eyes:     General: Lids are normal.        Right eye: No discharge.        Left eye: No discharge.     Conjunctiva/sclera: Conjunctivae normal.  Pulmonary:     Effort: Pulmonary effort is normal. No accessory muscle usage or respiratory distress.  Musculoskeletal:     Cervical back: Normal range of motion.  Neurological:     Mental Status: She is alert and oriented to person, place, and time.  Psychiatric:        Attention and Perception: Attention normal.        Mood and Affect: Mood normal.        Behavior: Behavior normal. Behavior is cooperative.        Thought Content: Thought content normal.        Judgment: Judgment normal.     Results for orders placed or performed in visit on 07/14/23  Bayer DCA Hb A1c Waived   Collection Time: 07/14/23 10:54 AM  Result Value Ref Range   HB A1C (BAYER DCA - WAIVED) 6.3 (H) 4.8 - 5.6 %  Microalbumin, Urine Waived   Collection Time: 07/14/23 10:54 AM  Result Value Ref Range   Microalb, Ur Waived 30 (H) 0 - 19 mg/L   Creatinine, Urine Waived 100 10 - 300 mg/dL   Microalb/Creat Ratio <30 <30 mg/g  Comprehensive metabolic panel   Collection Time: 07/14/23 10:58 AM  Result Value Ref Range   Glucose 108 (H) 70 - 99 mg/dL   BUN 16 6 - 24 mg/dL   Creatinine, Ser 0.98 0.57 - 1.00 mg/dL   eGFR 89 >11  BJ/YNW/2.95   BUN/Creatinine Ratio 20 9 - 23   Sodium 137 134 - 144 mmol/L   Potassium 4.4 3.5 - 5.2 mmol/L   Chloride 100 96 - 106 mmol/L   CO2 22 20 - 29 mmol/L   Calcium 10.1 8.7 - 10.2 mg/dL   Total Protein 6.8 6.0 - 8.5 g/dL   Albumin 4.3 3.9 - 4.9 g/dL   Globulin, Total 2.5 1.5 - 4.5 g/dL   Bilirubin Total 0.8 0.0 - 1.2 mg/dL   Alkaline Phosphatase  95 44 - 121 IU/L   AST 14 0 - 40 IU/L   ALT 23 0 - 32 IU/L  CBC with Differential/Platelet   Collection Time: 07/14/23 10:58 AM  Result Value Ref Range   WBC 8.9 3.4 - 10.8 x10E3/uL   RBC 4.68 3.77 - 5.28 x10E6/uL   Hemoglobin 14.4 11.1 - 15.9 g/dL   Hematocrit 44.0 10.2 - 46.6 %   MCV 94 79 - 97 fL   MCH 30.8 26.6 - 33.0 pg   MCHC 32.9 31.5 - 35.7 g/dL   RDW 72.5 (L) 36.6 - 44.0 %   Platelets 387 150 - 450 x10E3/uL   Neutrophils 57 Not Estab. %   Lymphs 35 Not Estab. %   Monocytes 5 Not Estab. %   Eos 2 Not Estab. %   Basos 1 Not Estab. %   Neutrophils Absolute 5.2 1.4 - 7.0 x10E3/uL   Lymphocytes Absolute 3.1 0.7 - 3.1 x10E3/uL   Monocytes Absolute 0.5 0.1 - 0.9 x10E3/uL   EOS (ABSOLUTE) 0.2 0.0 - 0.4 x10E3/uL   Basophils Absolute 0.1 0.0 - 0.2 x10E3/uL   Immature Granulocytes 0 Not Estab. %   Immature Grans (Abs) 0.0 0.0 - 0.1 x10E3/uL  Lipid Panel w/o Chol/HDL Ratio   Collection Time: 07/14/23 10:58 AM  Result Value Ref Range   Cholesterol, Total 207 (H) 100 - 199 mg/dL   Triglycerides 347 (H) 0 - 149 mg/dL   HDL 39 (L) >42 mg/dL   VLDL Cholesterol Cal 34 5 - 40 mg/dL   LDL Chol Calc (NIH) 595 (H) 0 - 99 mg/dL  TSH   Collection Time: 07/14/23 10:58 AM  Result Value Ref Range   TSH 1.290 0.450 - 4.500 uIU/mL  VITAMIN D 25 Hydroxy (Vit-D Deficiency, Fractures)   Collection Time: 07/14/23 10:58 AM  Result Value Ref Range   Vit D, 25-Hydroxy 40.3 30.0 - 100.0 ng/mL      Assessment & Plan:   Problem List Items Addressed This Visit       Other   Anxiety and depression - Primary   Chronic, exacerbated at  this time due to boyfriend breaking up with her. Denies SI/HI.  Did not tolerate Wellbutrin and has not started Buspar.  Will continue Prozac but increase to 60 MG daily, has been on for long while and offers benefit, plus recommend she trial Buspar.  Educated her on this medication and possible side effects.  Return in 6 weeks.      Relevant Medications   busPIRone (BUSPAR) 5 MG tablet   FLUoxetine (PROZAC) 20 MG capsule     Follow up plan: Return in about 6 weeks (around 10/07/2023) for ANXIETY.

## 2023-08-27 DIAGNOSIS — Z419 Encounter for procedure for purposes other than remedying health state, unspecified: Secondary | ICD-10-CM | POA: Diagnosis not present

## 2023-08-29 ENCOUNTER — Ambulatory Visit: Payer: BC Managed Care – PPO | Admitting: Urology

## 2023-08-29 VITALS — BP 115/77 | HR 85

## 2023-08-29 DIAGNOSIS — N3946 Mixed incontinence: Secondary | ICD-10-CM

## 2023-08-29 DIAGNOSIS — N3281 Overactive bladder: Secondary | ICD-10-CM | POA: Diagnosis not present

## 2023-08-29 LAB — URINALYSIS, COMPLETE
Bilirubin, UA: NEGATIVE
Glucose, UA: NEGATIVE
Ketones, UA: NEGATIVE
Leukocytes,UA: NEGATIVE
Nitrite, UA: NEGATIVE
Protein,UA: NEGATIVE
RBC, UA: NEGATIVE
Specific Gravity, UA: 1.015 (ref 1.005–1.030)
Urobilinogen, Ur: 0.2 mg/dL (ref 0.2–1.0)
pH, UA: 7 (ref 5.0–7.5)

## 2023-08-29 LAB — MICROSCOPIC EXAMINATION

## 2023-08-29 MED ORDER — SOLIFENACIN SUCCINATE 10 MG PO TABS: 10.0000 mg | ORAL_TABLET | Freq: Every day | ORAL | 11 refills | Status: AC

## 2023-08-29 NOTE — Progress Notes (Signed)
 Appointment has been made and reminder has been mailed

## 2023-08-29 NOTE — Progress Notes (Signed)
 08/29/2023 9:59 AM   Tiffany Odom 05-11-1976 [id number]  Referring provider: Marjie Skiff, NP [address] [place],  [place] [address]  Chief Complaint  Patient presents with   Follow-up    HPI: I was consulted to assess the patient's urinary incontinence.  She leaks with coughing sneezing sometimes with bending and lifting.  Sometimes incontinence with lifting is significant.  She has urge incontinence.  Both are significant.  She has no bedwetting.  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  She wears 2 or 3 pads a day with varying amount of leakage sometimes depending on activity   She can void every 60 to 90 minutes and other times every 2-3 hours.  Sometimes she gets up once a night.  She voids with a strong flow   She has had a hysterectomy.  Oxybutynin caused dizziness but helped up to 75% in the past.     Mild grade 2 hypermobility of the bladder neck and leaked a few drops. No cystocele. No rectocele    Patient has mixed incontinence.  She has frequency and milder nocturia.     On urodynamics patient voided 129 mL with a maximal of 8 mL/s.  Residual was a few milliliters.  Maximum bladder capacity was 200 mL.  She increased bladder sensation.  Bladder was unstable reaching pressures of 24 cm of water.  The overactivity was also triggered and impressive with severe urge incontinence.  Many leak point pressures were assessed.  She did leak once with a Valsalva leak point pressure 200 mL or 33 cm of water with mild leakage.  During voiding she voided 93 mL with a maximal flow of 8 mL/s.  Maximum voiding pressure of 14 cm water.  It was well-sustained.  Residual 0 mL.  EMG activity was normal.  Bladder neck descent at less than a centimeter.     Patient had mild grade 2 hypermobility the bladder neck and did leak a few drops after cystoscopy.  She had 2 very small suburethral swellings within normal limits.  I do not think she has a diverticulum.   Cystoscopy: Patient underwent  flexible cystoscopy.  Bladder mucosa and trigone were normal.  No cystitis.  No carcinoma.  Very well-tolerated.  No diverticulum noted     Patient has mixed incontinence.  She has significant overactive bladder.  75% overactive bladder and 25% stress incontinence. She has failed oxybutynin.  I will try to help her with medical and behavioral therapy.  Physical therapy offered but she thinks she has done it in the past and failed.  If she does not reach her treatment goal she could be offered a bulking agent or sling.  If she could live with her stress incontinence she could be offered third line therapies.  She may be at higher risk of persistent or worsening overactive bladder symptoms based upon urodynamics.  Reassess in 6 weeks on Gemtesa samples and prescription.    Having less high-volume leakage.  She says she is less than 20% improved.  She says primarily she can leak with almost no warning.  Clinically not infected but sent urine for culture.     Reassess in 6 weeks on Myrbetriq 50 mg samples and prescription.  Reassess severity of each symptom and quantitate them in treatment goals and discuss refractory overactive bladder pathway versus the stress incontinence pathway.  Bulking agent may be better option then sling due to refractory overactive bladder.     Today Last culture  negative Urgency stable incontinence stable.  She never filled the Myrbetriq because was $400.  She said Leslye Peer worked reasonably well but again insurance would not cover it.  She is prone to dizziness.  She is not having dizziness now.   Patient will try Vesicare 5 mg 30 x 11. She understands she has a mixed pathway with no clear definite solution. It be nice and Vesicare helped her. Dizziness as a side effect discussed. Other side effects discussed. Reevaluate pathway and treatments in 6 weeks. Call if culture positive   Today Patient is dramatically better.  She is not having the high-volume episodes of wearing thick  pads and now just wears a liner.  She wears a larger pad at work just in case she gets urgency.  She says the urgency component is most significant.  Frequency stable and clinically not infected  PMH: Past Medical History:  Diagnosis Date   Depression    Diabetes (HCC)    Hyperlipemia    Hypertension     Surgical History: Past Surgical History:  Procedure Laterality Date   BILATERAL CARPAL TUNNEL RELEASE     CESAREAN SECTION     CHOLECYSTECTOMY     TOTAL ABDOMINAL HYSTERECTOMY W/ BILATERAL SALPINGOOPHORECTOMY      Home Medications:  Allergies as of 08/29/2023       Reactions   Oxycodone-acetaminophen Rash   Overly sedated on Percocet at time of lithotripsy 2010   Sulfamethoxazole-trimethoprim Rash   Drug rash January 2017   Lisinopril Cough   Rosuvastatin Other (See Comments)   Cyclobenzaprine Rash   "rasied Blood pressure"         Medication List        Accurate as of August 29, 2023  9:59 AM. If you have any questions, ask your nurse or doctor.          Accu-Chek Aviva Plus test strip Generic drug: glucose blood 1 each by Other route 2 (two) times daily. Use as instructed   Accu-Chek Aviva Plus w/Device Kit 1 kit by Does not apply route 2 (two) times daily.   Accu-Chek Softclix Lancets lancets 1 each by Other route 2 (two) times daily. Use as instructed   amLODipine 5 MG tablet Commonly known as: NORVASC Take 1 tablet (5 mg total) by mouth daily.   atorvastatin 80 MG tablet Commonly known as: LIPITOR Take 1 tablet (80 mg total) by mouth daily.   busPIRone 5 MG tablet Commonly known as: BUSPAR Take 1 tablet (5 mg total) by mouth 2 (two) times daily.   ezetimibe 10 MG tablet Commonly known as: ZETIA Take 1 tablet (10 mg total) by mouth daily.   FLUoxetine 40 MG capsule Commonly known as: PROZAC Take 1 capsule (40 mg total) by mouth daily.   FLUoxetine 20 MG capsule Commonly known as: PROZAC Take 1 capsule (20 mg total) by mouth daily. Take  with 40 MG Prozac capsule to = 60 MG total daily.   irbesartan 300 MG tablet Commonly known as: AVAPRO Take 1 tablet (300 mg total) by mouth daily.   solifenacin 5 MG tablet Commonly known as: VESICARE Take 1 tablet (5 mg total) by mouth daily.        Allergies:  Allergies  Allergen Reactions   Oxycodone-Acetaminophen Rash    Overly sedated on Percocet at time of lithotripsy 2010   Sulfamethoxazole-Trimethoprim Rash    Drug rash January 2017   Lisinopril Cough   Rosuvastatin Other (See Comments)   Cyclobenzaprine Rash    "  rasied Blood pressure"     Family History: Family History  Problem Relation Age of Onset   Diabetes Mother    ADD / ADHD Mother    Heart Problems Mother    Sleep apnea Mother    Diabetes Father    Heart Problems Father    Healthy Sister    Healthy Sister    Healthy Sister    Healthy Sister    Healthy Sister    Healthy Brother    Heart Problems Maternal Grandmother    Diabetes Maternal Grandmother    Heart Problems Paternal Grandmother    Diabetes Paternal Grandmother     Social History:  reports that she has never smoked. She has never been exposed to tobacco smoke. She has never used smokeless tobacco. She reports current alcohol use. She reports that she does not use drugs.  ROS:                                        Physical Exam: BP 115/77   Pulse 85   Constitutional:  Alert and oriented, No acute distress. HEENT: [place] AT, moist mucus membranes.  Trachea midline, no masses.   Laboratory Data: Lab Results  Component Value Date   WBC 8.9 07/14/2023   HGB 14.4 07/14/2023   HCT 43.8 07/14/2023   MCV 94 07/14/2023   PLT 387 07/14/2023    Lab Results  Component Value Date   CREATININE 0.82 07/14/2023    No results found for: "PSA"  No results found for: "TESTOSTERONE"  Lab Results  Component Value Date   HGBA1C 6.3 (H) 07/14/2023    Urinalysis    Component Value Date/Time   COLORURINE YELLOW  (A) 04/15/2022 0228   APPEARANCEUR Clear 07/04/2023 0959   LABSPEC 1.020 04/15/2022 0228   PHURINE 7.0 04/15/2022 0228   GLUCOSEU Negative 07/04/2023 0959   HGBUR NEGATIVE 04/15/2022 0228   BILIRUBINUR Negative 07/04/2023 0959   KETONESUR 5 (A) 04/15/2022 0228   PROTEINUR Negative 07/04/2023 0959   PROTEINUR 30 (A) 04/15/2022 0228   NITRITE Negative 07/04/2023 0959   NITRITE NEGATIVE 04/15/2022 0228   LEUKOCYTESUR Negative 07/04/2023 0959   LEUKOCYTESUR SMALL (A) 04/15/2022 0228    Pertinent Imaging:   Assessment & Plan: Reassess in 5 months on Vesicare 10 mg 3 x 11.  Hopefully the higher dose works even better.  Mention of the physical therapy again but she has not done pelvic floor in the past.  I think she is pleased with the results so far.  We both agree that the overactive bladder appears to be what is most significant  1. OAB (overactive bladder) (Primary)  - Urinalysis, Complete  2. Mixed incontinence  - Urinalysis, Complete   No follow-ups on file.  Martina Sinner, MD  [facility] [address], [address] [place], [place] [address] [phone]

## 2023-09-01 LAB — CULTURE, URINE COMPREHENSIVE

## 2023-10-03 NOTE — Patient Instructions (Signed)
 Be Involved in Caring For Your Health:  Taking Medications When medications are taken as directed, they can greatly improve your health. But if they are not taken as prescribed, they may not work. In some cases, not taking them correctly can be harmful. To help ensure your treatment remains effective and safe, understand your medications and how to take them. Bring your medications to each visit for review by your provider.  Your lab results, notes, and after visit summary will be available on My Chart. We strongly encourage you to use this feature. If lab results are abnormal the clinic will contact you with the appropriate steps. If the clinic does not contact you assume the results are satisfactory. You can always view your results on My Chart. If you have questions regarding your health or results, please contact the clinic during office hours. You can also ask questions on My Chart.  We at [facility] are grateful that you chose [place] to provide your care. We strive to provide evidence-based and compassionate care and are always looking for feedback. If you get a survey from the clinic please complete this so we can hear your opinions.  Managing Anxiety, Adult After being diagnosed with anxiety, you may be relieved to know why you have felt or behaved a certain way. You may also feel overwhelmed about the treatment ahead and what it will mean for your life. With care and support, you can manage your anxiety. How to manage lifestyle changes Understanding the difference between stress and anxiety Although stress can play a role in anxiety, it is not the same as anxiety. Stress is your body's reaction to life changes and events, both good and bad. Stress is often caused by something external, such as a deadline, test, or competition. It normally goes away after the event has ended and will last just a few hours. But, stress can be ongoing and can lead to more than just stress. Anxiety is  caused by something internal, such as imagining a terrible outcome or worrying that something will go wrong that will greatly upset you. Anxiety often does not go away even after the event is over, and it can become a long-term (chronic) worry. Lowering stress and anxiety Talk with your health care provider or a counselor to learn more about lowering anxiety and stress. They may suggest tension-reduction techniques, such as: Music. Spend time creating or listening to music that you enjoy and that inspires you. Mindfulness-based meditation. Practice being aware of your normal breaths while not trying to control your breathing. It can be done while sitting or walking. Centering prayer. Focus on a word, phrase, or sacred image that means something to you and brings you peace. Deep breathing. Expand your stomach and inhale slowly through your nose. Hold your breath for 3-5 seconds. Then breathe out slowly, letting your stomach muscles relax. Self-talk. Learn to notice and spot thought patterns that lead to anxiety reactions. Change those patterns to thoughts that feel peaceful. Muscle relaxation. Take time to tense muscles and then relax them. Choose a tension-reduction technique that fits your lifestyle and personality. These techniques take time and practice. Set aside 5-15 minutes a day to do them. Specialized therapists can offer counseling and training in these techniques. The training to help with anxiety may be covered by some insurance plans. Other things you can do to manage stress and anxiety include: Keeping a stress diary. This can help you learn what triggers your reaction and then learn ways  to manage your response. Thinking about how you react to certain situations. You may not be able to control everything, but you can control your response. Making time for activities that help you relax and not feeling guilty about spending your time in this way. Doing visual imagery. This involves  imagining or creating mental pictures to help you relax. Practicing yoga. Through yoga poses, you can lower tension and relax.  Medicines Medicines for anxiety include: Antidepressant medicines. These are usually prescribed for long-term daily control. Anti-anxiety medicines. These may be added in severe cases, especially when panic attacks occur. When used together, medicines, psychotherapy, and tension-reduction techniques may be the most effective treatment. Relationships Relationships can play a big part in helping you recover. Spend more time connecting with trusted friends and family members. Think about going to couples counseling if you have a partner, taking family education classes, or going to family therapy. Therapy can help you and others better understand your anxiety. How to recognize changes in your anxiety Everyone responds differently to treatment for anxiety. Recovery from anxiety happens when symptoms lessen and stop interfering with your daily life at home or work. This may mean that you will start to: Have better concentration and focus. Worry will interfere less in your daily thinking. Sleep better. Be less irritable. Have more energy. Have improved memory. Try to recognize when your condition is getting worse. Contact your provider if your symptoms interfere with home or work and you feel like your condition is not improving. Follow these instructions at home: Activity Exercise. Adults should: Exercise for at least 150 minutes each week. The exercise should increase your heart rate and make you sweat (moderate-intensity exercise). Do strengthening exercises at least twice a week. Get the right amount and quality of sleep. Most adults need 7-9 hours of sleep each night. Lifestyle  Eat a healthy diet that includes plenty of vegetables, fruits, whole grains, low-fat dairy products, and lean protein. Do not eat a lot of foods that are high in fats, added sugars, or salt  (sodium). Make choices that simplify your life. Do not use any products that contain nicotine or tobacco. These products include cigarettes, chewing tobacco, and vaping devices, such as e-cigarettes. If you need help quitting, ask your provider. Avoid caffeine, alcohol, and certain over-the-counter cold medicines. These may make you feel worse. Ask your pharmacist which medicines to avoid. General instructions Take over-the-counter and prescription medicines only as told by your provider. Keep all follow-up visits. This is to make sure you are managing your anxiety well or if you need more support. Where to find support You can get help and support from: Self-help groups. Online and [employer]. A trusted spiritual leader. Couples counseling. Family education classes. Family therapy. Where to find more information You may find that joining a support group helps you deal with your anxiety. The following sources can help you find counselors or support groups near you: Mental Health America: mentalhealthamerica.net Anxiety and Depression Association of [place] (ADAA): adaa.org The [employer] on Mental Illness (NAMI): nami.org Contact a health care provider if: You have a hard time staying focused or finishing tasks. You spend many hours a day feeling worried about everyday life. You are very tired because you cannot stop worrying. You start to have headaches or often feel tense. You have chronic nausea or diarrhea. Get help right away if: Your heart feels like it is racing. You have shortness of breath. You have thoughts of hurting yourself or others. Get help  right away if you feel like you may hurt yourself or others, or have thoughts about taking your own life. Go to your nearest emergency room or: Call 911. Call the National Suicide Prevention Lifeline at 765-482-1593 or 988. This is open 24 hours a day. Text the Crisis Text Line at 504 124 9896. This information is not  intended to replace advice given to you by your health care provider. Make sure you discuss any questions you have with your health care provider. Document Revised: 03/23/2022 Document Reviewed: 10/05/2020 Elsevier Patient Education  2024 [employer].

## 2023-10-08 DIAGNOSIS — Z419 Encounter for procedure for purposes other than remedying health state, unspecified: Secondary | ICD-10-CM | POA: Diagnosis not present

## 2023-10-10 ENCOUNTER — Ambulatory Visit (INDEPENDENT_AMBULATORY_CARE_PROVIDER_SITE_OTHER): Admitting: Nurse Practitioner

## 2023-10-10 ENCOUNTER — Encounter: Payer: Self-pay | Admitting: Nurse Practitioner

## 2023-10-10 VITALS — BP 129/74 | HR 78 | Temp 98.5°F | Ht 61.0 in | Wt 191.2 lb

## 2023-10-10 DIAGNOSIS — F32A Depression, unspecified: Secondary | ICD-10-CM

## 2023-10-10 DIAGNOSIS — F419 Anxiety disorder, unspecified: Secondary | ICD-10-CM | POA: Diagnosis not present

## 2023-10-10 NOTE — Assessment & Plan Note (Signed)
 Chronic, ongoing. Appointment recommended for June, 2025 to recheck A1c. Will discuss Ozempic for weight loss at next scheduled appointment.

## 2023-10-10 NOTE — Progress Notes (Signed)
 BP 129/74   Pulse 78   Temp 98.5 F (36.9 C) (Oral)   Ht 5\' 1"  (1.549 m)   Wt 191 lb 3.2 oz (86.7 kg)   SpO2 97%   BMI 36.13 kg/m    Subjective:    Patient ID: Tiffany Odom, female    DOB: Jun 17, 1976, 48 y.o.   MRN: [id number]  HPI: Tiffany Odom is a 48 y.o. female reports feeling better.   Chief Complaint  Patient presents with   Anxiety   NOTE WRITTEN BY DNP STUDENT.  ASSESSMENT AND PLAN OF CARE REVIEWED WITH STUDENT, AGREE WITH ABOVE FINDINGS AND PLAN.   ANXIETY/STRESS Follow-up today for anxiety that was exacerbated by recent break-up.  Increase Prozac to 60 MG on 08/26/23. To be taking Buspar 5 MG BID with this.  Wellbutrin caused poor sleep pattern and some anxiety.  Duration:stable Anxious mood: no  Excessive worrying: no Irritability:  sometimes   Sweating: no Nausea: no Palpitations:no Hyperventilation: no Panic attacks: no Agoraphobia: no  Obscessions/compulsions: no Depressed mood: no    10/10/2023   11:02 AM 08/26/2023    3:23 PM 07/14/2023   10:48 AM 08/25/2022    8:44 AM 04/20/2022   11:34 AM  Depression screen PHQ 2/9  Decreased Interest 2 1 1 1 2   Down, Depressed, Hopeless 2 3 1 2 2   PHQ - 2 Score 4 4 2 3 4   Altered sleeping 1 2 2 1 1   Tired, decreased energy 3 2 2 1 3   Change in appetite 3 2 3 2 2   Feeling bad or failure about yourself  1 2 1 1 2   Trouble concentrating 1 1 1 1 1   Moving slowly or fidgety/restless 0 0 0 0 0  Suicidal thoughts 0 0 0 0 0  PHQ-9 Score 13 13 11 9 13   Difficult doing work/chores Somewhat difficult Somewhat difficult Somewhat difficult Somewhat difficult Somewhat difficult  Anhedonia: no Weight changes: no Insomnia: no hard to fall asleep  Hypersomnia: no Fatigue/loss of energy: yes Feelings of worthlessness: no Feelings of guilt:  sometimes Impaired concentration/indecisiveness: no Suicidal ideations: no  Crying spells: no Recent Stressors/Life Changes:  communicating with who is in the process of moving  closer.    Relationship problems: no   Family stress: yes     Financial stress: yes    Job stress: no    Recent death/loss: no   Relevant past medical, surgical, family and social history reviewed and updated as indicated. Interim medical history since our last visit reviewed. Allergies and medications reviewed and updated.  Review of Systems  Constitutional:  Negative for activity change.  HENT: Negative.    Eyes: Negative.   Respiratory:  Negative for chest tightness and wheezing.   Cardiovascular:  Negative for leg swelling.  Gastrointestinal: Negative.   Endocrine: Negative.   Genitourinary: Negative.   Musculoskeletal: Negative.   Skin: Negative.   Allergic/Immunologic: Negative.   Neurological:  Negative for headaches.  Hematological: Negative.   Psychiatric/Behavioral:  Negative for agitation and suicidal ideas. The patient is not nervous/anxious.     Per HPI unless specifically indicated above     Objective:    BP 129/74   Pulse 78   Temp 98.5 F (36.9 C) (Oral)   Ht 5\' 1"  (1.549 m)   Wt 191 lb 3.2 oz (86.7 kg)   SpO2 97%   BMI 36.13 kg/m   Wt Readings from Last 3 Encounters:  10/10/23 191 lb 3.2 oz (86.7 kg)  07/14/23  192 lb 9.6 oz (87.4 kg)  07/04/23 194 lb (88 kg)    Physical Exam Constitutional:      Appearance: Normal appearance. She is well-developed and well-groomed. She is obese. She is not ill-appearing.  Neck:     Thyroid: No thyroid mass.     Vascular: No carotid bruit.  Cardiovascular:     Rate and Rhythm: Normal rate and regular rhythm.     Heart sounds: Normal heart sounds. No murmur heard. Pulmonary:     Effort: Pulmonary effort is normal. No respiratory distress.     Breath sounds: Normal breath sounds. No wheezing.  Abdominal:     General: Bowel sounds are normal.     Palpations: Abdomen is soft.     Tenderness: There is no guarding.  Musculoskeletal:        General: Normal range of motion.     Cervical back: Normal range of  motion and neck supple.     Right lower leg: No edema.     Left lower leg: No edema.  Lymphadenopathy:     Cervical: No cervical adenopathy.     Right cervical: No superficial cervical adenopathy.    Left cervical: No superficial cervical adenopathy.  Skin:    General: Skin is warm and dry.  Neurological:     General: No focal deficit present.     Mental Status: She is alert and oriented to person, place, and time. Mental status is at baseline.     Deep Tendon Reflexes: Reflexes are normal and symmetric.  Psychiatric:        Attention and Perception: Attention and perception normal.        Mood and Affect: Mood and affect normal.        Speech: Speech normal.        Behavior: Behavior normal. Behavior is cooperative.        Thought Content: Thought content normal.        Cognition and Memory: Cognition and memory normal.        Judgment: Judgment normal.     Results for orders placed or performed in visit on 08/29/23  Microscopic Examination   Collection Time: 08/29/23  9:33 AM   Urine  Result Value Ref Range   WBC, UA 0-5 0 - 5 /hpf   RBC, Urine 0-2 0 - 2 /hpf   Epithelial Cells (non renal) 0-10 0 - 10 /hpf   Bacteria, UA Moderate (A) None seen/Few  Urinalysis, Complete   Collection Time: 08/29/23  9:33 AM  Result Value Ref Range   Specific Gravity, UA 1.015 1.005 - 1.030   pH, UA 7.0 5.0 - 7.5   Color, UA Yellow Yellow   Appearance Ur Clear Clear   Leukocytes,UA Negative Negative   Protein,UA Negative Negative/Trace   Glucose, UA Negative Negative   Ketones, UA Negative Negative   RBC, UA Negative Negative   Bilirubin, UA Negative Negative   Urobilinogen, Ur 0.2 0.2 - 1.0 mg/dL   Nitrite, UA Negative Negative   Microscopic Examination See below:   CULTURE, URINE COMPREHENSIVE   Collection Time: 08/29/23 11:23 AM   Specimen: Urine   UR  Result Value Ref Range   Urine Culture, Comprehensive Final report    Organism ID, Bacteria Comment       Assessment &  Plan:   Problem List Items Addressed This Visit       Other   Anxiety and depression - Primary   Chronic, stable. Feeling better at  this time. Communicating with ex-boyfriend and reports that he is moving back this way from [place] . Not sure if here locally or [place]  where his mother resides. Continue current medication regimen Fluoxetine 60 mg daily, and Buspirone 5 mg BID. Return in June for A1c follow up.        Follow up plan: Return in about 2 months (around 12/14/2023) for T2DM, HTN/HLD, ANXIETY.

## 2023-10-10 NOTE — Assessment & Plan Note (Signed)
 Chronic, stable. Feeling better at this time. Communicating with ex-boyfriend and reports that he is moving back this way from [place] . Not sure if here locally or [place]  where his mother resides. Continue current medication regimen Fluoxetine 60 mg daily, and Buspirone 5 mg BID. Return in June for A1c follow up.

## 2023-10-10 NOTE — Progress Notes (Deleted)
 There were no vitals taken for this visit.   Subjective:    Patient ID: Tiffany Odom, female    DOB: May 25, 1976, 48 y.o.   MRN: [id number]  HPI: Tiffany Odom is a 48 y.o. female  Chief Complaint  Patient presents with   Anxiety   ANXIETY/STRESS Follow-up today for anxiety that was exacerbated by recent break-up.  Increase Prozac to 60 MG on 08/26/23. To be taking Buspar 5 MG BID with this.  Wellbutrin caused poor sleep pattern and some anxiety.  Duration:{Blank single:19197::"controlled","uncontrolled","better","worse","exacerbated","stable"} Anxious mood: {Blank single:19197::"yes","no"}  Excessive worrying: {Blank single:19197::"yes","no"} Irritability: {Blank single:19197::"yes","no"}  Sweating: {Blank single:19197::"yes","no"} Nausea: {Blank single:19197::"yes","no"} Palpitations:{Blank single:19197::"yes","no"} Hyperventilation: {Blank single:19197::"yes","no"} Panic attacks: {Blank single:19197::"yes","no"} Agoraphobia: {Blank single:19197::"yes","no"}  Obscessions/compulsions: {Blank single:19197::"yes","no"} Depressed mood: {Blank single:19197::"yes","no"}    08/26/2023    3:23 PM 07/14/2023   10:48 AM 08/25/2022    8:44 AM 04/20/2022   11:34 AM 02/17/2022    9:53 AM  Depression screen PHQ 2/9  Decreased Interest 1 1 1 2 2   Down, Depressed, Hopeless 3 1 2 2 1   PHQ - 2 Score 4 2 3 4 3   Altered sleeping 2 2 1 1 1   Tired, decreased energy 2 2 1 3 3   Change in appetite 2 3 2 2 2   Feeling bad or failure about yourself  2 1 1 2 2   Trouble concentrating 1 1 1 1 1   Moving slowly or fidgety/restless 0 0 0 0 0  Suicidal thoughts 0 0 0 0 1  PHQ-9 Score 13 11 9 13 13   Difficult doing work/chores Somewhat difficult Somewhat difficult Somewhat difficult Somewhat difficult Somewhat difficult  Anhedonia: {Blank single:19197::"yes","no"} Weight changes: {Blank single:19197::"yes","no"} Insomnia: {Blank single:19197::"yes","no"} {Blank single:19197::"hard to fall asleep","hard  to stay asleep"}  Hypersomnia: {Blank single:19197::"yes","no"} Fatigue/loss of energy: {Blank single:19197::"yes","no"} Feelings of worthlessness: {Blank single:19197::"yes","no"} Feelings of guilt: {Blank single:19197::"yes","no"} Impaired concentration/indecisiveness: {Blank single:19197::"yes","no"} Suicidal ideations: {Blank single:19197::"yes","no"}  Crying spells: {Blank single:19197::"yes","no"} Recent Stressors/Life Changes: {Blank single:19197::"yes","no"}   Relationship problems: {Blank single:19197::"yes","no"}   Family stress: {Blank single:19197::"yes","no"}     Financial stress: {Blank single:19197::"yes","no"}    Job stress: {Blank single:19197::"yes","no"}    Recent death/loss: {Blank single:19197::"yes","no"}   Relevant past medical, surgical, family and social history reviewed and updated as indicated. Interim medical history since our last visit reviewed. Allergies and medications reviewed and updated.  Review of Systems  Per HPI unless specifically indicated above     Objective:    There were no vitals taken for this visit.  Wt Readings from Last 3 Encounters:  07/14/23 192 lb 9.6 oz (87.4 kg)  07/04/23 194 lb (88 kg)  01/24/23 185 lb (83.9 kg)    Physical Exam  Results for orders placed or performed in visit on 08/29/23  Microscopic Examination   Collection Time: 08/29/23  9:33 AM   Urine  Result Value Ref Range   WBC, UA 0-5 0 - 5 /hpf   RBC, Urine 0-2 0 - 2 /hpf   Epithelial Cells (non renal) 0-10 0 - 10 /hpf   Bacteria, UA Moderate (A) None seen/Few  Urinalysis, Complete   Collection Time: 08/29/23  9:33 AM  Result Value Ref Range   Specific Gravity, UA 1.015 1.005 - 1.030   pH, UA 7.0 5.0 - 7.5   Color, UA Yellow Yellow   Appearance Ur Clear Clear   Leukocytes,UA Negative Negative   Protein,UA Negative Negative/Trace   Glucose, UA Negative Negative   Ketones, UA Negative Negative   RBC, UA Negative Negative  Bilirubin, UA Negative  Negative   Urobilinogen, Ur 0.2 0.2 - 1.0 mg/dL   Nitrite, UA Negative Negative   Microscopic Examination See below:   CULTURE, URINE COMPREHENSIVE   Collection Time: 08/29/23 11:23 AM   Specimen: Urine   UR  Result Value Ref Range   Urine Culture, Comprehensive Final report    Organism ID, Bacteria Comment       Assessment & Plan:   Problem List Items Addressed This Visit   None    Follow up plan: No follow-ups on file.

## 2023-11-07 DIAGNOSIS — Z419 Encounter for procedure for purposes other than remedying health state, unspecified: Secondary | ICD-10-CM | POA: Diagnosis not present

## 2023-12-08 DIAGNOSIS — Z419 Encounter for procedure for purposes other than remedying health state, unspecified: Secondary | ICD-10-CM | POA: Diagnosis not present

## 2023-12-10 NOTE — Patient Instructions (Signed)
Be Involved in Caring For Your Health:  Taking Medications When medications are taken as directed, they can greatly improve your health. But if they are not taken as prescribed, they may not work. In some cases, not taking them correctly can be harmful. To help ensure your treatment remains effective and safe, understand your medications and how to take them. Bring your medications to each visit for review by your provider.  Your lab results, notes, and after visit summary will be available on My Chart. We strongly encourage you to use this feature. If lab results are abnormal the clinic will contact you with the appropriate steps. If the clinic does not contact you assume the results are satisfactory. You can always view your results on My Chart. If you have questions regarding your health or results, please contact the clinic during office hours. You can also ask questions on My Chart.  We at [facility] are grateful that you chose [place] to provide your care. We strive to provide evidence-based and compassionate care and are always looking for feedback. If you get a survey from the clinic please complete this so we can hear your opinions.  Diabetes Mellitus and Exercise Regular exercise is important for your health, especially if you have diabetes mellitus. Exercise is not just about losing weight. It can also help you increase muscle strength and bone density and reduce body fat and stress. This can help your level of endurance and make you more fit and flexible. Why should I exercise if I have diabetes? Exercise has many benefits for people with diabetes. It can: Help lower and control your blood sugar (glucose). Help your body respond better and become more sensitive to the hormone insulin. Reduce how much insulin your body needs. Lower your risk for heart disease by: Lowering how much "bad" cholesterol and triglycerides you have in your body. Increasing how much "good" cholesterol  you have in your body. Lowering your blood pressure. Lowering your blood glucose levels. What is my activity plan? Your health care provider or an expert trained in diabetes care (certified diabetes educator) can help you make an activity plan. This plan can help you find the type of exercise that works for you. It may also tell you how often to exercise and for how long. Be sure to: Get at least 150 minutes of medium-intensity or high-intensity exercise each week. This may involve brisk walking, biking, or water aerobics. Do stretching and strengthening exercises at least 2 times a week. This may involve yoga or weight lifting. Spread out your activity over at least 3 days of the week. Get some form of physical activity each day. Do not go more than 2 days in a row without some kind of activity. Avoid being inactive for more than 30 minutes at a time. Take frequent breaks to walk or stretch. Choose activities that you enjoy. Set goals that you know you can accomplish. Start slowly and increase the intensity of your exercise over time. How do I manage my diabetes during exercise?  Monitor your blood glucose Check your blood glucose before and after you exercise. If your blood glucose is 240 mg/dL (40.9 mmol/L) or higher before you exercise, check your urine for ketones. These are chemicals created by the liver. If you have ketones in your urine, do not exercise until your blood glucose returns to normal. If your blood glucose is 100 mg/dL (5.6 mmol/L) or lower, eat a snack that has 15-20 grams of carbohydrate in  it. Check your blood glucose 15 minutes after the snack to make sure that your level is above 100 mg/dL (5.6 mmol/L) before you start to exercise. Your risk for low blood glucose (hypoglycemia) goes up during and after exercise. Know the symptoms of this condition and how to treat it. Follow these instructions at home: Keep a carbohydrate snack on hand for use before, during, and after  exercise. This can help prevent or treat hypoglycemia. Avoid injecting insulin into parts of your body that are going to be used during exercise. This may include: Your arms, when you are going to play tennis. Your legs, when you are about to go jogging. Keep track of your exercise habits. This can help you and your health care provider watch and adjust your activity plan. Write down: What you eat before and after you exercise. Blood glucose levels before and after you exercise. The type and amount of exercise you do. Talk to your health care provider before you start a new activity. They may need to: Make sure that the activity is safe for you. Adjust your insulin, other medicines, and food that you eat. Drink water while you exercise. This can stop you from losing too much water (dehydration). It can also prevent problems caused by having a lot of heat in your body (heat stroke). Where to find more information American Diabetes Association: diabetes.org Association of Diabetes Care & Education Specialists: diabeteseducator.org This information is not intended to replace advice given to you by your health care provider. Make sure you discuss any questions you have with your health care provider. Document Revised: 12/02/2021 Document Reviewed: 12/02/2021 Elsevier Patient Education  2024 [employer].

## 2023-12-12 ENCOUNTER — Ambulatory Visit (INDEPENDENT_AMBULATORY_CARE_PROVIDER_SITE_OTHER): Admitting: Nurse Practitioner

## 2023-12-12 ENCOUNTER — Encounter: Payer: Self-pay | Admitting: Nurse Practitioner

## 2023-12-12 ENCOUNTER — Ambulatory Visit: Payer: Self-pay | Admitting: Nurse Practitioner

## 2023-12-12 DIAGNOSIS — E1159 Type 2 diabetes mellitus with other circulatory complications: Secondary | ICD-10-CM | POA: Diagnosis not present

## 2023-12-12 DIAGNOSIS — I152 Hypertension secondary to endocrine disorders: Secondary | ICD-10-CM | POA: Diagnosis not present

## 2023-12-12 DIAGNOSIS — E1169 Type 2 diabetes mellitus with other specified complication: Secondary | ICD-10-CM

## 2023-12-12 DIAGNOSIS — F32A Depression, unspecified: Secondary | ICD-10-CM

## 2023-12-12 DIAGNOSIS — E785 Hyperlipidemia, unspecified: Secondary | ICD-10-CM | POA: Diagnosis not present

## 2023-12-12 DIAGNOSIS — F419 Anxiety disorder, unspecified: Secondary | ICD-10-CM

## 2023-12-12 DIAGNOSIS — G4733 Obstructive sleep apnea (adult) (pediatric): Secondary | ICD-10-CM | POA: Diagnosis not present

## 2023-12-12 DIAGNOSIS — Z1211 Encounter for screening for malignant neoplasm of colon: Secondary | ICD-10-CM

## 2023-12-12 LAB — BAYER DCA HB A1C WAIVED: HB A1C (BAYER DCA - WAIVED): 6.5 % — ABNORMAL HIGH (ref 4.8–5.6)

## 2023-12-12 MED ORDER — FLUOXETINE HCL 40 MG PO CAPS: 40.0000 mg | ORAL_CAPSULE | Freq: Every day | ORAL | 4 refills | Status: AC

## 2023-12-12 MED ORDER — BUSPIRONE HCL 5 MG PO TABS: 5.0000 mg | ORAL_TABLET | Freq: Two times a day (BID) | ORAL | 3 refills | Status: AC

## 2023-12-12 MED ORDER — AMLODIPINE BESYLATE 5 MG PO TABS: 5.0000 mg | ORAL_TABLET | Freq: Every day | ORAL | 4 refills | Status: AC

## 2023-12-12 MED ORDER — ATORVASTATIN CALCIUM 80 MG PO TABS: 80.0000 mg | ORAL_TABLET | Freq: Every day | ORAL | 4 refills | Status: AC

## 2023-12-12 MED ORDER — FLUOXETINE HCL 20 MG PO CAPS: 20.0000 mg | ORAL_CAPSULE | Freq: Every day | ORAL | 1 refills | Status: AC

## 2023-12-12 MED ORDER — IRBESARTAN 300 MG PO TABS: 300.0000 mg | ORAL_TABLET | Freq: Every day | ORAL | 4 refills | Status: AC

## 2023-12-12 MED ORDER — EZETIMIBE 10 MG PO TABS: 10.0000 mg | ORAL_TABLET | Freq: Every day | ORAL | 4 refills | Status: AC

## 2023-12-12 NOTE — Assessment & Plan Note (Signed)
 BMI 35.81with T2DM, HTN/HLD.  Recommended eating smaller high protein, low fat meals more frequently and exercising 30 mins a day 5 times a week with a goal of 10-15lb weight loss in the next 3 months. Patient voiced their understanding and motivation to adhere to these recommendations.

## 2023-12-12 NOTE — Assessment & Plan Note (Signed)
 Chronic, and improving at this time.  Moving out of [place] upcoming. Denies SI/HI.  Did not tolerate Wellbutrin  in the past.  Continue Prozac  60 MG daily, has been on for long while and offers benefit, plus continue Buspar .  Educated her on this medication and possible side effects.

## 2023-12-12 NOTE — Assessment & Plan Note (Signed)
 Chronic, ongoing.  Continue current medication regimen and adjust as needed. Lipid panel today.

## 2023-12-12 NOTE — Progress Notes (Signed)
 Contacted via MyChart  A1c is 6.5%, so it did go up a little but still at goal.  The only concern I have with starting Ozempic is there will be no follow-up to ensure you are tolerating. It may be best to start Ozempic once you find a new primary care, so they can make sure you tolerate it.

## 2023-12-12 NOTE — Assessment & Plan Note (Signed)
 Chronic, ongoing.  BP close to goal today, missed a few days of medication. Continue Irbesartan  300 MG daily and Amlodipine  5 MG daily, however if low BPs present stop Amlodipine  -- discussed with Tiffany Odom.  Recommend she monitor BP at least a few mornings a week at home and document.  DASH diet at home.  Labs: CMP.  Urine ALB 28 July 2023.  Could consider change to Candesartan in future, which may also benefit migraines.

## 2023-12-12 NOTE — Assessment & Plan Note (Signed)
Not using CPAP, did not tolerate masks -- recommend she reach out to sleep provider to discuss as would benefit from treatment.  Could consider Inspire in future.

## 2023-12-12 NOTE — Progress Notes (Signed)
 BP 134/78 (BP Location: Left Arm, Patient Position: Sitting, Cuff Size: Normal)   Pulse 74   Temp 98.7 F (37.1 C) (Oral)   Resp 15   Ht 5' 0.98 (1.549 m)   Wt 189 lb 6.4 oz (85.9 kg)   SpO2 98%   BMI 35.81 kg/m    Subjective:    Patient ID: Tiffany Odom, female    DOB: July 29, 1975, 48 y.o.   MRN: [id number]  HPI: Tiffany Odom is a 48 y.o. female  Chief Complaint  Patient presents with   Diabetes    Some home checks. Prepped for foot exam, this is her 1st. Also has yet to see eye doctor this year. Referral placed.   Hypertension    Some home checks but not recent   Anxiety    Doing better, Prozac  upped and seems to be helping.    DIABETES Last A1c 6.3%, diet-controlled diabetic. Hypoglycemic episodes:no Polydipsia/polyuria: no Visual disturbance: no Chest pain: no Paresthesias: no Glucose Monitoring: no  Accucheck frequency: Not Checking  Fasting glucose:  Post prandial:  Evening:  Before meals: Taking Insulin?: no  Long acting insulin:  Short acting insulin: Blood Pressure Monitoring: not checking Retinal Examination: Not up to Date -- needs to schedule one Foot Exam: Up to Date Diabetic Education: Not Completed Pneumovax: Up to Date PPSV23 in past Influenza: Up to Date Aspirin: no   HYPERTENSION / HYPERLIPIDEMIA Taking Atorvastatin , Amlodipine , and Irbesartan .  Started on Irbesartan  back in April 2022 due to cough with ACE.  Had cough with Lisinopril after being on this for 10 years.  Rosuvastatin caused pruritus, currently taking Zetia  and Atorvastatin .  They are moving to [place]  next month with her significant other.  Diagnosed with moderate sleep apnea by [facility] 08/03/21 . She tried multiple different masks and did not tolerate.  Satisfied with current treatment? yes Duration of hypertension: chronic BP monitoring frequency: not checking BP range:  BP medication side effects: no Duration of hyperlipidemia: chronic Cholesterol  medication side effects: no Cholesterol supplements: none Medication compliance: good compliance Aspirin: no Recent stressors: no Recurrent headaches: no Visual changes: no Palpitations: no Dyspnea: no Chest pain: no Lower extremity edema: no Dizzy/lightheaded: no   DEPRESSION Continues to take Prozac  60 MG and Buspar  5 MG BID. Mood status: improving Satisfied with current treatment?: yes Symptom severity: moderate  Duration of current treatment : chronic Side effects: no Medication compliance: good compliance Psychotherapy/counseling: none Depressed mood: occasional Anxious mood:  occasional Anhedonia: no Significant weight loss or gain: no Insomnia: occasionally -- with waking up often Fatigue: a little bit Feelings of worthlessness or guilt: no Impaired concentration/indecisiveness: no Suicidal ideations: no Hopelessness: no Crying spells: no    12/12/2023    8:14 AM 10/10/2023   11:02 AM 08/26/2023    3:23 PM 07/14/2023   10:48 AM 08/25/2022    8:44 AM  Depression screen PHQ 2/9  Decreased Interest 2 2 1 1 1   Down, Depressed, Hopeless 1 2 3 1 2   PHQ - 2 Score 3 4 4 2 3   Altered sleeping 3 1 2 2 1   Tired, decreased energy 2 3 2 2 1   Change in appetite 1 3 2 3 2   Feeling bad or failure about yourself  1 1 2 1 1   Trouble concentrating 0 1 1 1 1   Moving slowly or fidgety/restless 0 0 0 0 0  Suicidal thoughts 0 0 0 0 0  PHQ-9 Score 10 13 13 11 9   Difficult doing  work/chores Somewhat difficult Somewhat difficult Somewhat difficult Somewhat difficult Somewhat difficult       12/12/2023    8:14 AM 10/10/2023   11:02 AM 08/26/2023    3:24 PM 07/14/2023   10:48 AM  GAD 7 : Generalized Anxiety Score  Nervous, Anxious, on Edge 1 2 2 2   Control/stop worrying 1 2 3 1   Worry too much - different things 1 1 2 1   Trouble relaxing 1 1 2 1   Restless 1 1 1 1   Easily annoyed or irritable 1 2 2 2   Afraid - awful might happen 1 1 2 2   Total GAD 7 Score 7 10 14 10   Anxiety  Difficulty Not difficult at all Somewhat difficult Somewhat difficult Somewhat difficult     Relevant past medical, surgical, family and social history reviewed and updated as indicated. Interim medical history since our last visit reviewed. Allergies and medications reviewed and updated.  Review of Systems  Constitutional:  Negative for activity change, appetite change, diaphoresis, fatigue and fever.  Respiratory:  Negative for cough, chest tightness and shortness of breath.   Cardiovascular:  Negative for chest pain, palpitations and leg swelling.  Gastrointestinal: Negative.   Endocrine: Negative for polydipsia, polyphagia and polyuria.  Neurological: Negative.   Psychiatric/Behavioral: Negative.      Per HPI unless specifically indicated above     Objective:    BP 134/78 (BP Location: Left Arm, Patient Position: Sitting, Cuff Size: Normal)   Pulse 74   Temp 98.7 F (37.1 C) (Oral)   Resp 15   Ht 5' 0.98 (1.549 m)   Wt 189 lb 6.4 oz (85.9 kg)   SpO2 98%   BMI 35.81 kg/m   Wt Readings from Last 3 Encounters:  12/12/23 189 lb 6.4 oz (85.9 kg)  10/10/23 191 lb 3.2 oz (86.7 kg)  07/14/23 192 lb 9.6 oz (87.4 kg)    Physical Exam Vitals and nursing note reviewed.  Constitutional:      General: She is awake. She is not in acute distress.    Appearance: She is well-developed and well-groomed. She is obese. She is not ill-appearing or toxic-appearing.  HENT:     Head: Normocephalic.     Right Ear: Hearing and external ear normal.     Left Ear: Hearing and external ear normal.   Eyes:     General: Lids are normal.        Right eye: No discharge.        Left eye: No discharge.     Conjunctiva/sclera: Conjunctivae normal.     Pupils: Pupils are equal, round, and reactive to light.   Neck:     Thyroid: No thyromegaly.     Vascular: No carotid bruit.   Cardiovascular:     Rate and Rhythm: Normal rate and regular rhythm.     Heart sounds: Normal heart sounds. No  murmur heard.    No gallop.  Pulmonary:     Effort: Pulmonary effort is normal. No accessory muscle usage or respiratory distress.     Breath sounds: Normal breath sounds.  Abdominal:     General: Bowel sounds are normal. There is no distension.     Palpations: Abdomen is soft.     Tenderness: There is no abdominal tenderness.   Musculoskeletal:     Cervical back: Normal range of motion and neck supple.     Right lower leg: No edema.     Left lower leg: No edema.  Lymphadenopathy:  Cervical: No cervical adenopathy.   Skin:    General: Skin is warm and dry.   Neurological:     Mental Status: She is alert and oriented to person, place, and time.     Deep Tendon Reflexes: Reflexes are normal and symmetric.     Reflex Scores:      Brachioradialis reflexes are 2+ on the right side and 2+ on the left side.      Patellar reflexes are 2+ on the right side and 2+ on the left side.  Psychiatric:        Attention and Perception: Attention normal.        Mood and Affect: Mood normal.        Speech: Speech normal.        Behavior: Behavior normal. Behavior is cooperative.        Thought Content: Thought content normal.    Diabetic Foot Exam - Simple   Simple Foot Form Visual Inspection No deformities, no ulcerations, no other skin breakdown bilaterally: Yes Sensation Testing Intact to touch and monofilament testing bilaterally: Yes Pulse Check Posterior Tibialis and Dorsalis pulse intact bilaterally: Yes Comments      Results for orders placed or performed in visit on 08/29/23  Microscopic Examination   Collection Time: 08/29/23  9:33 AM   Urine  Result Value Ref Range   WBC, UA 0-5 0 - 5 /hpf   RBC, Urine 0-2 0 - 2 /hpf   Epithelial Cells (non renal) 0-10 0 - 10 /hpf   Bacteria, UA Moderate (A) None seen/Few  Urinalysis, Complete   Collection Time: 08/29/23  9:33 AM  Result Value Ref Range   Specific Gravity, UA 1.015 1.005 - 1.030   pH, UA 7.0 5.0 - 7.5   Color,  UA Yellow Yellow   Appearance Ur Clear Clear   Leukocytes,UA Negative Negative   Protein,UA Negative Negative/Trace   Glucose, UA Negative Negative   Ketones, UA Negative Negative   RBC, UA Negative Negative   Bilirubin, UA Negative Negative   Urobilinogen, Ur 0.2 0.2 - 1.0 mg/dL   Nitrite, UA Negative Negative   Microscopic Examination See below:   CULTURE, URINE COMPREHENSIVE   Collection Time: 08/29/23 11:23 AM   Specimen: Urine   UR  Result Value Ref Range   Urine Culture, Comprehensive Final report    Organism ID, Bacteria Comment       Assessment & Plan:   Problem List Items Addressed This Visit       Cardiovascular and Mediastinum   Hypertension associated with diabetes (HCC)   Chronic, ongoing.  BP close to goal today, missed a few days of medication. Continue Irbesartan  300 MG daily and Amlodipine  5 MG daily, however if low BPs present stop Amlodipine  -- discussed with her.  Recommend she monitor BP at least a few mornings a week at home and document.  DASH diet at home.  Labs: CMP.  Urine ALB 28 July 2023.  Could consider change to Candesartan in future, which may also benefit migraines.        Relevant Medications   amLODipine  (NORVASC ) 5 MG tablet   atorvastatin  (LIPITOR) 80 MG tablet   ezetimibe  (ZETIA ) 10 MG tablet   irbesartan  (AVAPRO ) 300 MG tablet   Other Relevant Orders   Bayer DCA Hb A1c Waived   Comprehensive metabolic panel with GFR     Respiratory   OSA (obstructive sleep apnea)   Not using CPAP, did not tolerate masks -- recommend she  reach out to sleep provider to discuss as would benefit from treatment.  Could consider [facility] in future.        Endocrine   Type 2 diabetes mellitus with morbid obesity (HCC) - Primary   Chronic, ongoing with A1c 6.3% last check, will recheck today, and urine ALB 28 July 2023.  At this time will continue diet control, discussed with patient options available if elevation in future; such as GLP1, SGLT2, or  Metformin.  She is working on diet changes and regular walks.  Recommend she check blood sugar at home a few days a week with goal in morning fasting <130 and goal 2 hours after eating <180.  - Eye exam needed.  Foot exam up to date. - Vaccinations up to date. - Statin and ARB on board.      Relevant Medications   atorvastatin  (LIPITOR) 80 MG tablet   irbesartan  (AVAPRO ) 300 MG tablet   Other Relevant Orders   Bayer DCA Hb A1c Waived   Ambulatory referral to Ophthalmology   Hyperlipidemia associated with type 2 diabetes mellitus (HCC)   Chronic, ongoing.  Continue current medication regimen and adjust as needed.  Lipid panel today.         Relevant Medications   amLODipine  (NORVASC ) 5 MG tablet   atorvastatin  (LIPITOR) 80 MG tablet   ezetimibe  (ZETIA ) 10 MG tablet   irbesartan  (AVAPRO ) 300 MG tablet   Other Relevant Orders   Bayer DCA Hb A1c Waived   Comprehensive metabolic panel with GFR   Lipid Panel w/o Chol/HDL Ratio     Other   Morbid obesity (HCC)   BMI 35.81with T2DM, HTN/HLD.  Recommended eating smaller high protein, low fat meals more frequently and exercising 30 mins a day 5 times a week with a goal of 10-15lb weight loss in the next 3 months. Patient voiced their understanding and motivation to adhere to these recommendations.       Anxiety and depression   Chronic, and improving at this time.  Moving out of [place] upcoming. Denies SI/HI.  Did not tolerate Wellbutrin  in the past.  Continue Prozac  60 MG daily, has been on for long while and offers benefit, plus continue Buspar .  Educated her on this medication and possible side effects.        Relevant Medications   busPIRone  (BUSPAR ) 5 MG tablet   FLUoxetine  (PROZAC ) 40 MG capsule   FLUoxetine  (PROZAC ) 20 MG capsule   Other Visit Diagnoses       Screening for colon cancer       Cologuard ordered.   Relevant Orders   Cologuard        Follow up plan: Return if symptoms worsen or fail to  improve.

## 2023-12-12 NOTE — Assessment & Plan Note (Signed)
 Chronic, ongoing with A1c 6.3% last check, will recheck today, and urine ALB 28 July 2023.  At this time will continue diet control, discussed with patient options available if elevation in future; such as GLP1, SGLT2, or Metformin.  She is working on diet changes and regular walks.  Recommend she check blood sugar at home a few days a week with goal in morning fasting <130 and goal 2 hours after eating <180.  - Eye exam needed.  Foot exam up to date. - Vaccinations up to date. - Statin and ARB on board.

## 2023-12-13 LAB — LIPID PANEL W/O CHOL/HDL RATIO
Cholesterol, Total: 292 mg/dL — ABNORMAL HIGH (ref 100–199)
HDL: 39 mg/dL — ABNORMAL LOW (ref >39)
LDL Chol Calc (NIH): 187 mg/dL — ABNORMAL HIGH (ref 0–99)
Triglycerides: 331 mg/dL — ABNORMAL HIGH (ref 0–149)
VLDL Cholesterol Cal: 66 mg/dL — ABNORMAL HIGH (ref 5–40)

## 2023-12-13 LAB — COMPREHENSIVE METABOLIC PANEL WITH GFR
ALT: 21 IU/L (ref 0–32)
AST: 15 IU/L (ref 0–40)
Albumin: 4.1 g/dL (ref 3.9–4.9)
Alkaline Phosphatase: 86 IU/L (ref 44–121)
BUN/Creatinine Ratio: 19 (ref 9–23)
BUN: 15 mg/dL (ref 6–24)
Bilirubin Total: 0.4 mg/dL (ref 0.0–1.2)
CO2: 23 mmol/L (ref 20–29)
Calcium: 10.1 mg/dL (ref 8.7–10.2)
Chloride: 101 mmol/L (ref 96–106)
Creatinine, Ser: 0.81 mg/dL (ref 0.57–1.00)
Globulin, Total: 2.4 g/dL (ref 1.5–4.5)
Glucose: 134 mg/dL — ABNORMAL HIGH (ref 70–99)
Potassium: 4.2 mmol/L (ref 3.5–5.2)
Sodium: 138 mmol/L (ref 134–144)
Total Protein: 6.5 g/dL (ref 6.0–8.5)
eGFR: 90 mL/min/1.73

## 2023-12-13 NOTE — Progress Notes (Signed)
 Contacted via MyChart  Good afternoon Tiffany Odom, your labs have returned: - Kidney function, creatinine and eGFR, remains normal, as is liver function, AST and ALT.  - Lipid panel continues to show high levels, please ensure you are taking Atorvastatin  daily for stroke prevention.  Any questions? Keep being amazing!!  Thank you for allowing me to participate in your care.  I appreciate you. Kindest regards, Joanna Hall

## 2024-01-07 DIAGNOSIS — Z419 Encounter for procedure for purposes other than remedying health state, unspecified: Secondary | ICD-10-CM | POA: Diagnosis not present

## 2024-01-30 ENCOUNTER — Ambulatory Visit: Admitting: Urology

## 2024-02-02 ENCOUNTER — Encounter: Payer: Self-pay | Admitting: Urology

## 2024-02-07 DIAGNOSIS — Z419 Encounter for procedure for purposes other than remedying health state, unspecified: Secondary | ICD-10-CM | POA: Diagnosis not present

## 2024-03-09 DIAGNOSIS — Z419 Encounter for procedure for purposes other than remedying health state, unspecified: Secondary | ICD-10-CM | POA: Diagnosis not present

## 2024-04-08 DIAGNOSIS — Z419 Encounter for procedure for purposes other than remedying health state, unspecified: Secondary | ICD-10-CM | POA: Diagnosis not present
